# Patient Record
Sex: Female | Born: 1980 | Race: White | Hispanic: No | Marital: Single | Smoking: Current every day smoker
Health system: Southern US, Community
[De-identification: ages and names within clinical notes are randomized; demographics above are authoritative.]

## PROBLEM LIST (undated history)

## (undated) DIAGNOSIS — O139 Gestational [pregnancy-induced] hypertension without significant proteinuria, unspecified trimester: Secondary | ICD-10-CM

## (undated) HISTORY — DX: Gestational (pregnancy-induced) hypertension without significant proteinuria, unspecified trimester: O13.9

## (undated) HISTORY — PX: TONSILLECTOMY: SUR1361

## (undated) HISTORY — PX: BARTHOLIN GLAND CYST EXCISION: SHX565

---

## 2012-10-29 DIAGNOSIS — O1495 Unspecified pre-eclampsia, complicating the puerperium: Secondary | ICD-10-CM

## 2015-11-18 ENCOUNTER — Emergency Department (HOSPITAL_COMMUNITY)
Admission: EM | Admit: 2015-11-18 | Discharge: 2015-11-18 | Disposition: A | Discharge status: Home / Self Care | Payer: Self-pay | Attending: Emergency Medicine | Admitting: Emergency Medicine

## 2015-11-18 ENCOUNTER — Encounter (HOSPITAL_COMMUNITY): Payer: Self-pay | Admitting: Emergency Medicine

## 2015-11-18 DIAGNOSIS — B9689 Other specified bacterial agents as the cause of diseases classified elsewhere: Secondary | ICD-10-CM | POA: Insufficient documentation

## 2015-11-18 DIAGNOSIS — N760 Acute vaginitis: Secondary | ICD-10-CM | POA: Insufficient documentation

## 2015-11-18 DIAGNOSIS — F17210 Nicotine dependence, cigarettes, uncomplicated: Secondary | ICD-10-CM | POA: Insufficient documentation

## 2015-11-18 DIAGNOSIS — N390 Urinary tract infection, site not specified: Secondary | ICD-10-CM | POA: Insufficient documentation

## 2015-11-18 LAB — URINALYSIS, ROUTINE W REFLEX MICROSCOPIC
Bilirubin Urine: NEGATIVE
GLUCOSE, UA: NEGATIVE mg/dL
KETONES UR: NEGATIVE mg/dL
Nitrite: NEGATIVE
PH: 6 (ref 5.0–8.0)
Protein, ur: NEGATIVE mg/dL
SPECIFIC GRAVITY, URINE: 1.016 (ref 1.005–1.030)

## 2015-11-18 LAB — COMPREHENSIVE METABOLIC PANEL
ALBUMIN: 3.9 g/dL (ref 3.5–5.0)
ALT: 16 U/L (ref 14–54)
ANION GAP: 8 (ref 5–15)
AST: 21 U/L (ref 15–41)
Alkaline Phosphatase: 63 U/L (ref 38–126)
BUN: 11 mg/dL (ref 6–20)
CHLORIDE: 107 mmol/L (ref 101–111)
CO2: 22 mmol/L (ref 22–32)
Calcium: 9.7 mg/dL (ref 8.9–10.3)
Creatinine, Ser: 0.82 mg/dL (ref 0.44–1.00)
GFR calc Af Amer: >60 mL/min (ref >60)
Glucose, Bld: 97 mg/dL (ref 65–99)
POTASSIUM: 3.8 mmol/L (ref 3.5–5.1)
Sodium: 137 mmol/L (ref 135–145)
Total Bilirubin: 0.4 mg/dL (ref 0.3–1.2)
Total Protein: 7.1 g/dL (ref 6.5–8.1)

## 2015-11-18 LAB — URINE MICROSCOPIC-ADD ON

## 2015-11-18 LAB — LIPASE, BLOOD: LIPASE: 27 U/L (ref 11–51)

## 2015-11-18 LAB — I-STAT BETA HCG BLOOD, ED (MC, WL, AP ONLY): I-stat hCG, quantitative: <5 m[IU]/mL (ref <5)

## 2015-11-18 LAB — WET PREP, GENITAL
Sperm: NONE SEEN
Trich, Wet Prep: NONE SEEN
WBC, Wet Prep HPF POC: NONE SEEN
YEAST WET PREP: NONE SEEN

## 2015-11-18 LAB — CBC
HEMATOCRIT: 41.1 % (ref 36.0–46.0)
HEMOGLOBIN: 13.7 g/dL (ref 12.0–15.0)
MCH: 30.1 pg (ref 26.0–34.0)
MCHC: 33.3 g/dL (ref 30.0–36.0)
MCV: 90.3 fL (ref 78.0–100.0)
Platelets: 217 10*3/uL (ref 150–400)
RBC: 4.55 MIL/uL (ref 3.87–5.11)
RDW: 12.2 % (ref 11.5–15.5)
WBC: 11.9 10*3/uL — AB (ref 4.0–10.5)

## 2015-11-18 MED ORDER — CEPHALEXIN 250 MG PO CAPS
500.0000 mg | ORAL_CAPSULE | Freq: Once | ORAL | Status: AC
  Administered 2015-11-18: 500 mg via ORAL
  Filled 2015-11-18: qty 2

## 2015-11-18 MED ORDER — CEPHALEXIN 500 MG PO CAPS: 500.0000 mg | ORAL_CAPSULE | Freq: Two times a day (BID) | ORAL | Status: DC

## 2015-11-18 MED ORDER — METRONIDAZOLE 500 MG PO TABS: 500.0000 mg | ORAL_TABLET | Freq: Two times a day (BID) | ORAL | Status: DC

## 2015-11-18 NOTE — Discharge Instructions (Signed)
Urinary Tract Infection Urinary tract infections (UTIs) can develop anywhere along your urinary tract. Your urinary tract is your body's drainage system for removing wastes and extra water. Your urinary tract includes two kidneys, two ureters, a bladder, and a urethra. Your kidneys are a pair of bean-shaped organs. Each kidney is about the size of your fist. They are located below your ribs, one on each side of your spine. CAUSES Infections are caused by microbes, which are microscopic organisms, including fungi, viruses, and bacteria. These organisms are so small that they can only be seen through a microscope. Bacteria are the microbes that most commonly cause UTIs. SYMPTOMS  Symptoms of UTIs may vary by age and gender of the patient and by the location of the infection. Symptoms in young women typically include a frequent and intense urge to urinate and a painful, burning feeling in the bladder or urethra during urination. Older women and men are more likely to be tired, shaky, and weak and have muscle aches and abdominal pain. A fever may mean the infection is in your kidneys. Other symptoms of a kidney infection include pain in your back or sides below the ribs, nausea, and vomiting. DIAGNOSIS To diagnose a UTI, your caregiver will ask you about your symptoms. Your caregiver will also ask you to provide a urine sample. The urine sample will be tested for bacteria and white blood cells. White blood cells are made by your body to help fight infection. TREATMENT  Typically, UTIs can be treated with medication. Because most UTIs are caused by a bacterial infection, they usually can be treated with the use of antibiotics. The choice of antibiotic and length of treatment depend on your symptoms and the type of bacteria causing your infection. HOME CARE INSTRUCTIONS  If you were prescribed antibiotics, take them exactly as your caregiver instructs you. Finish the medication even if you feel better after  you have only taken some of the medication.  Drink enough water and fluids to keep your urine clear or pale yellow.  Avoid caffeine, tea, and carbonated beverages. They tend to irritate your bladder.  Empty your bladder often. Avoid holding urine for long periods of time.  Empty your bladder before and after sexual intercourse.  After a bowel movement, women should cleanse from front to back. Use each tissue only once. SEEK MEDICAL CARE IF:   You have back pain.  You develop a fever.  Your symptoms do not begin to resolve within 3 days. SEEK IMMEDIATE MEDICAL CARE IF:   You have severe back pain or lower abdominal pain.  You develop chills.  You have nausea or vomiting.  You have continued burning or discomfort with urination. MAKE SURE YOU:   Understand these instructions.  Will watch your condition.  Will get help right away if you are not doing well or get worse.   This information is not intended to replace advice given to you by your health care provider. Make sure you discuss any questions you have with your health care provider.   Document Released: 02/23/2005 Document Revised: 02/04/2015 Document Reviewed: 06/24/2011 Elsevier Interactive Patient Education 2016 Elsevier Inc. Bacterial Vaginosis Bacterial vaginosis is a vaginal infection that occurs when the normal balance of bacteria in the vagina is disrupted. It results from an overgrowth of certain bacteria. This is the most common vaginal infection in women of childbearing age. Treatment is important to prevent complications, especially in pregnant women, as it can cause a premature delivery. CAUSES  Bacterial   vaginosis is caused by an increase in harmful bacteria that are normally present in smaller amounts in the vagina. Several different kinds of bacteria can cause bacterial vaginosis. However, the reason that the condition develops is not fully understood. RISK FACTORS Certain activities or behaviors can  put you at an increased risk of developing bacterial vaginosis, including:  Having a new sex partner or multiple sex partners.  Douching.  Using an intrauterine device (IUD) for contraception. Women do not get bacterial vaginosis from toilet seats, bedding, swimming pools, or contact with objects around them. SIGNS AND SYMPTOMS  Some women with bacterial vaginosis have no signs or symptoms. Common symptoms include:  Grey vaginal discharge.  A fishlike odor with discharge, especially after sexual intercourse.  Itching or burning of the vagina and vulva.  Burning or pain with urination. DIAGNOSIS  Your health care provider will take a medical history and examine the vagina for signs of bacterial vaginosis. A sample of vaginal fluid may be taken. Your health care provider will look at this sample under a microscope to check for bacteria and abnormal cells. A vaginal pH test may also be done.  TREATMENT  Bacterial vaginosis may be treated with antibiotic medicines. These may be given in the form of a pill or a vaginal cream. A second round of antibiotics may be prescribed if the condition comes back after treatment. Because bacterial vaginosis increases your risk for sexually transmitted diseases, getting treated can help reduce your risk for chlamydia, gonorrhea, HIV, and herpes. HOME CARE INSTRUCTIONS   Only take over-the-counter or prescription medicines as directed by your health care provider.  If antibiotic medicine was prescribed, take it as directed. Make sure you finish it even if you start to feel better.  Tell all sexual partners that you have a vaginal infection. They should see their health care provider and be treated if they have problems, such as a mild rash or itching.  During treatment, it is important that you follow these instructions:  Avoid sexual activity or use condoms correctly.  Do not douche.  Avoid alcohol as directed by your health care provider.  Avoid  breastfeeding as directed by your health care provider. SEEK MEDICAL CARE IF:   Your symptoms are not improving after 3 days of treatment.  You have increased discharge or pain.  You have a fever. MAKE SURE YOU:   Understand these instructions.  Will watch your condition.  Will get help right away if you are not doing well or get worse. FOR MORE INFORMATION  Centers for Disease Control and Prevention, Division of STD Prevention: www.cdc.gov/std American Sexual Health Association (ASHA): www.ashastd.org    This information is not intended to replace advice given to you by your health care provider. Make sure you discuss any questions you have with your health care provider.   Document Released: 05/16/2005 Document Revised: 06/06/2014 Document Reviewed: 12/26/2012 Elsevier Interactive Patient Education 2016 Elsevier Inc.  

## 2015-11-18 NOTE — ED Provider Notes (Signed)
CSN: [id number]     Arrival date & time 11/18/15  1422 History  By signing my name below, I, Tanda Rockers, attest that this documentation has been prepared under the direction and in the presence of Hannah Farrier, PA-C. Electronically Signed: Tanda Rockers, ED Scribe. 11/18/2015. 7:20 PM.   Chief Complaint  Patient presents with  . Abdominal Pain    The history is provided by the patient. No language interpreter was used.    HPI Comments: Hannah Hamilton is a 35 y.o. female who presents to the Emergency Department complaining of gradual onset, constant, suprapubic abdominal pain x 1 week. Pt also complains of a cramping pain with urination, urinary frequency, nausea and dysuria. She took Ibuprofen earlier today without relief. Pt is sexually active but does not use protection. Hx STDs. She cannot specifically say when her LMP was but states sometime in May. Pt has been having irregular menses. PSHx cesarean section.Denies vaginal bleeding, vaginal discharge, urgency, vomiting, diarrhea, rash, cough, shortness of breath, sore throat, mouth sores, lightheadedness, syncope, back pain, or any other associated symptoms.    History reviewed. No pertinent past medical history. History reviewed. No pertinent past surgical history. No family history on file. Social History  Substance Use Topics  . Smoking status: Current Every Day Smoker -- 0.50 packs/day    Types: Cigarettes  . Smokeless tobacco: None  . Alcohol Use: No   OB History    No data available     Review of Systems  Constitutional: Negative for fever and chills.  HENT: Negative for congestion and sore throat.   Eyes: Negative for visual disturbance.  Respiratory: Negative for cough, shortness of breath and wheezing.   Cardiovascular: Negative for chest pain and palpitations.  Gastrointestinal: Positive for nausea and abdominal pain. Negative for vomiting and diarrhea.  Genitourinary: Positive for dysuria and frequency. Negative  for urgency, vaginal bleeding and vaginal discharge.  Musculoskeletal: Negative for back pain and neck pain.  Skin: Negative for rash.  Neurological: Positive for dizziness. Negative for syncope, light-headedness and headaches.   Allergies  Review of patient's allergies indicates no known allergies.  Home Medications   Prior to Admission medications   Medication Sig Start Date End Date Taking? Authorizing Provider  cephALEXin (KEFLEX) 500 MG capsule Take 1 capsule (500 mg total) by mouth 2 (two) times daily. 11/18/15   Hannah Farrier, PA-C  ibuprofen (ADVIL,MOTRIN) 200 MG tablet Take 400 mg by mouth every 6 (six) hours as needed for moderate pain.   Yes Historical Provider, MD  metroNIDAZOLE (FLAGYL) 500 MG tablet Take 1 tablet (500 mg total) by mouth 2 (two) times daily. 11/18/15   Hannah Farrier, PA-C   BP 115/74 mmHg  Pulse 62  Temp(Src) 98.2 F (36.8 C) (Oral)  Resp 16  SpO2 99%  LMP 11/04/2015   Physical Exam  Constitutional: She appears well-developed and well-nourished. No distress.  Nontoxic appearing.  HENT:  Head: Normocephalic and atraumatic.  Mouth/Throat: Oropharynx is clear and moist.  Eyes: Conjunctivae are normal. Pupils are equal, round, and reactive to light. Right eye exhibits no discharge. Left eye exhibits no discharge.  Neck: Neck supple.  Cardiovascular: Normal rate, regular rhythm, normal heart sounds and intact distal pulses.  Exam reveals no gallop and no friction rub.   No murmur heard. Pulmonary/Chest: Effort normal and breath sounds normal. No respiratory distress. She has no wheezes. She has no rales.  Abdominal: Soft. Bowel sounds are normal. She exhibits no mass. There is tenderness. There is no  rebound and no guarding.  Mild suprapubic tenderness to palpation. No RLQ tenderness. No rebound tenderness. No CVA or flank tenderness. No Psoas of Obturator's sign.   Genitourinary: Vaginal discharge found.  Pelvic exam performed by me with female RN  chaperone. Large amount of white vaginal discharge noted. No cervical motion tenderness. No vaginal bleeding. No external lesions or rashes. She has mild suprapubic tenderness. No adnexal tenderness or dullness.  Musculoskeletal: She exhibits no edema.  Lymphadenopathy:    She has no cervical adenopathy.  Neurological: She is alert. Coordination normal.  Skin: Skin is warm and dry. No rash noted. She is not diaphoretic. No erythema. No pallor.  Psychiatric: She has a normal mood and affect. Her behavior is normal.  Nursing note and vitals reviewed.   ED Course  Procedures (including critical care time)  DIAGNOSTIC STUDIES: Oxygen Saturation is 100% on RA, normal by my interpretation.    COORDINATION OF CARE: 7:19 PM-Discussed treatment plan which includes pelvic exam with pt at bedside and pt agreed to plan.   Labs Review Labs Reviewed  WET PREP, GENITAL - Abnormal; Notable for the following:    Clue Cells Wet Prep HPF POC PRESENT (*)    All other components within normal limits  CBC - Abnormal; Notable for the following:    WBC 11.9 (*)    All other components within normal limits  URINALYSIS, ROUTINE W REFLEX MICROSCOPIC (NOT AT [facility]) - Abnormal; Notable for the following:    APPearance CLOUDY (*)    Hgb urine dipstick MODERATE (*)    Leukocytes, UA MODERATE (*)    All other components within normal limits  URINE MICROSCOPIC-ADD ON - Abnormal; Notable for the following:    Squamous Epithelial / LPF 0-5 (*)    Bacteria, UA MANY (*)    All other components within normal limits  LIPASE, BLOOD  COMPREHENSIVE METABOLIC PANEL  I-STAT BETA HCG BLOOD, ED ([facility], [facility], [facility] ONLY)  GC/CHLAMYDIA PROBE AMP ([place]) NOT AT [facility]    Imaging Review No results found. I have personally reviewed and evaluated these lab results as part of my medical decision-making.   EKG Interpretation None     Filed Vitals:   11/18/15 1900 11/18/15 1915 11/18/15 1930 11/18/15 2130  BP: 104/58 118/87  109/79 115/74  Pulse: 63 104 63 62  Temp:    98.2 F (36.8 C)  TempSrc:    Oral  Resp:    16  SpO2: 97% 98% 98% 99%    MDM   Meds given in ED:  Medications  cephALEXin (KEFLEX) capsule 500 mg (500 mg Oral Given 11/18/15 2128)    Discharge Medication List as of 11/18/2015  9:25 PM    START taking these medications   Details  cephALEXin (KEFLEX) 500 MG capsule Take 1 capsule (500 mg total) by mouth 2 (two) times daily., Starting 11/18/2015, Until Discontinued, Print    metroNIDAZOLE (FLAGYL) 500 MG tablet Take 1 tablet (500 mg total) by mouth 2 (two) times daily., Starting 11/18/2015, Until Discontinued, Print        Final diagnoses:  UTI (lower urinary tract infection)  BV (bacterial vaginosis)   This  is a 35 y.o. female who presents to the Emergency Department complaining of gradual onset, constant, suprapubic abdominal pain x 1 week. Pt also complains of a cramping pain with urination, urinary frequency, nausea and dysuria. She took Ibuprofen earlier today without relief. Pt is sexually active but does not use protection. Hx STDs. She cannot  specifically say when her LMP was but states sometime in May. Pt has been having irregular menses. She denies vaginal bleeding or vaginal discharge. On exam the patient is afebrile nontoxic appearing. She has mild suprapubic tenderness to palpation. No CVA or flank tenderness. On pelvic exam she has moderate white vaginal discharge. No cervical motion tenderness. No adnexal tenderness or fullness. Pregnancy test is negative. Urinalysis shows moderate leukocytes with many bacteria. Lipase is within normal limits. CMP is within normal limits. CBC is remarkable only for leukocytosis with a white count of 11,900. Wet prep reveals clue cells. It is otherwise unremarkable.  We'll treat patient for a urinary tract infection and bacterial vaginosis with Keflex and Flagyl. I encouraged her to follow-up with primary care. I discussed return precautions. I  advised the patient to follow-up with their primary care provider this week. I advised the patient to return to the emergency department with new or worsening symptoms or new concerns. The patient verbalized understanding and agreement with plan.    I personally performed the services described in this documentation, which was scribed in my presence. The recorded information has been reviewed and is accurate.         Hannah FarrierWilliam Reita Shindler, PA-C 11/18/15 2149  Arby BarretteMarcy Pfeiffer, MD 11/18/15 [phone]

## 2015-11-18 NOTE — ED Notes (Signed)
Pt states for the last week she has had lower abd pain worse at the end of urination. Pt denies any back or flank pain.

## 2015-11-19 LAB — GC/CHLAMYDIA PROBE AMP (~~LOC~~) NOT AT ARMC
CHLAMYDIA, DNA PROBE: NEGATIVE
NEISSERIA GONORRHEA: NEGATIVE

## 2016-01-07 ENCOUNTER — Emergency Department (HOSPITAL_COMMUNITY)
Admission: EM | Admit: 2016-01-07 | Discharge: 2016-01-07 | Disposition: A | Discharge status: Home / Self Care | Payer: Self-pay | Attending: Emergency Medicine | Admitting: Emergency Medicine

## 2016-01-07 ENCOUNTER — Encounter (HOSPITAL_COMMUNITY): Payer: Self-pay | Admitting: Emergency Medicine

## 2016-01-07 DIAGNOSIS — F17210 Nicotine dependence, cigarettes, uncomplicated: Secondary | ICD-10-CM | POA: Insufficient documentation

## 2016-01-07 DIAGNOSIS — Z79899 Other long term (current) drug therapy: Secondary | ICD-10-CM | POA: Insufficient documentation

## 2016-01-07 DIAGNOSIS — N390 Urinary tract infection, site not specified: Secondary | ICD-10-CM | POA: Insufficient documentation

## 2016-01-07 DIAGNOSIS — Z791 Long term (current) use of non-steroidal anti-inflammatories (NSAID): Secondary | ICD-10-CM | POA: Insufficient documentation

## 2016-01-07 LAB — URINALYSIS, ROUTINE W REFLEX MICROSCOPIC
Bilirubin Urine: NEGATIVE
GLUCOSE, UA: NEGATIVE mg/dL
KETONES UR: 15 mg/dL — AB
Nitrite: POSITIVE — AB
PROTEIN: 100 mg/dL — AB
Specific Gravity, Urine: 1.02 (ref 1.005–1.030)
pH: 6 (ref 5.0–8.0)

## 2016-01-07 LAB — URINE MICROSCOPIC-ADD ON

## 2016-01-07 LAB — PREGNANCY, URINE: Preg Test, Ur: NEGATIVE

## 2016-01-07 MED ORDER — CEPHALEXIN 500 MG PO CAPS: 500.0000 mg | ORAL_CAPSULE | Freq: Four times a day (QID) | ORAL | 0 refills | Status: DC

## 2016-01-07 MED ORDER — SODIUM CHLORIDE 0.9 % IJ SOLN
INTRAMUSCULAR | Status: AC
  Filled 2016-01-07: qty 10

## 2016-01-07 MED ORDER — IBUPROFEN 400 MG PO TABS
400.0000 mg | ORAL_TABLET | Freq: Once | ORAL | Status: AC
  Administered 2016-01-07: 400 mg via ORAL
  Filled 2016-01-07: qty 1

## 2016-01-07 MED ORDER — FLUCONAZOLE 100 MG PO TABS
200.0000 mg | ORAL_TABLET | Freq: Once | ORAL | Status: AC
  Administered 2016-01-07: 200 mg via ORAL
  Filled 2016-01-07: qty 2

## 2016-01-07 MED ORDER — TRAMADOL HCL 50 MG PO TABS: ORAL_TABLET | ORAL | 0 refills | Status: DC

## 2016-01-07 MED ORDER — CEFTRIAXONE SODIUM 1 G IJ SOLR
1.0000 g | Freq: Once | INTRAMUSCULAR | Status: AC
  Administered 2016-01-07: 1 g via INTRAMUSCULAR
  Filled 2016-01-07: qty 10

## 2016-01-07 MED ORDER — HYDROCODONE-ACETAMINOPHEN 5-325 MG PO TABS
2.0000 | ORAL_TABLET | Freq: Once | ORAL | Status: AC
  Administered 2016-01-07: 2 via ORAL
  Filled 2016-01-07: qty 2

## 2016-01-07 MED ORDER — ONDANSETRON HCL 4 MG PO TABS
4.0000 mg | ORAL_TABLET | Freq: Once | ORAL | Status: AC
  Administered 2016-01-07: 4 mg via ORAL
  Filled 2016-01-07: qty 1

## 2016-01-07 NOTE — ED Provider Notes (Signed)
[facility]-EMERGENCY DEPT Provider Note   CSN: [id number] Arrival date & time: 01/07/16  1728  First Provider Contact:  None       History   Chief Complaint Chief Complaint  Patient presents with  . Recurrent UTI    HPI Janeann ForehandFaith Deboer is a 35 y.o. female.  Patient is a 35 year old female who presents to the emergency department with a complaint of difficulty with urination.  The patient states that over the last 10 days she's been having increasing frequency of urination. She is having pain over the bladder area. At times she is having right flank area on pain. She's had chills and fever on. She's had mild nausea, but no actual vomiting. She states that time she has episodes of feeling weak, but she has been able to drink take care of her activities of daily living without problem. She has tried over-the-counter medications for urinary tract infection, but states these are not helping. Nothing seems to make the problem any worse.   The history is provided by the patient.    History reviewed. No pertinent past medical history.  There are no active problems to display for this patient.   History reviewed. No pertinent surgical history.  OB History    No data available       Home Medications    Prior to Admission medications   Medication Sig Start Date End Date Taking? Authorizing Provider  cephALEXin (KEFLEX) 500 MG capsule Take 1 capsule (500 mg total) by mouth 2 (two) times daily. 11/18/15   Everlene FarrierWilliam Dansie, PA-C  ibuprofen (ADVIL,MOTRIN) 200 MG tablet Take 400 mg by mouth every 6 (six) hours as needed for moderate pain.    Historical Provider, MD  metroNIDAZOLE (FLAGYL) 500 MG tablet Take 1 tablet (500 mg total) by mouth 2 (two) times daily. 11/18/15   Everlene FarrierWilliam Dansie, PA-C    Family History History reviewed. No pertinent family history.  Social History Social History  Substance Use Topics  . Smoking status: Current Every Day Smoker    Packs/day: 0.50    Types:  Cigarettes  . Smokeless tobacco: Never Used  . Alcohol use No     Allergies   Review of patient's allergies indicates no known allergies.   Review of Systems Review of Systems  Constitutional: Positive for chills and fever. Negative for activity change.       All ROS Neg except as noted in HPI  HENT: Negative for nosebleeds.   Eyes: Negative for photophobia and discharge.  Respiratory: Negative for cough, shortness of breath and wheezing.   Cardiovascular: Negative for chest pain and palpitations.  Gastrointestinal: Negative for abdominal pain and blood in stool.  Genitourinary: Positive for dysuria, flank pain and frequency. Negative for hematuria.  Musculoskeletal: Negative for arthralgias, back pain and neck pain.  Skin: Negative.   Neurological: Negative for dizziness, seizures and speech difficulty.  Psychiatric/Behavioral: Negative for confusion and hallucinations.     Physical Exam Updated Vital Signs BP 115/63   Pulse 83   Temp 100.7 F (38.2 C)   Resp 18   Ht 5\' 4"  (1.626 m)   Wt 68 kg   LMP 12/27/2015 (Exact Date)   SpO2 99%   BMI 25.75 kg/m   Physical Exam  Constitutional: She is oriented to person, place, and time. She appears well-developed and well-nourished.  Non-toxic appearance.  HENT:  Head: Normocephalic.  Right Ear: Tympanic membrane and external ear normal.  Left Ear: Tympanic membrane and external ear normal.  Eyes:  EOM and lids are normal. Pupils are equal, round, and reactive to light.  Neck: Normal range of motion. Neck supple. Carotid bruit is not present.  Cardiovascular: Normal rate, regular rhythm, normal heart sounds, intact distal pulses and normal pulses.   Pulmonary/Chest: Breath sounds normal. No respiratory distress.  Abdominal: Soft. Bowel sounds are normal. There is no hepatosplenomegaly. There is no tenderness. There is CVA tenderness. There is no guarding.  Musculoskeletal: Normal range of motion.  Lymphadenopathy:        Head (right side): No submandibular adenopathy present.       Head (left side): No submandibular adenopathy present.    She has no cervical adenopathy.  Neurological: She is alert and oriented to person, place, and time. She has normal strength. No cranial nerve deficit or sensory deficit.  Skin: Skin is warm and dry.  Psychiatric: She has a normal mood and affect. Her speech is normal.  Nursing note and vitals reviewed.    ED Treatments / Results  Labs (all labs ordered are listed, but only abnormal results are displayed) Labs Reviewed  URINALYSIS, ROUTINE W REFLEX MICROSCOPIC (NOT AT [facility]) - Abnormal; Notable for the following:       Result Value   APPearance HAZY (*)    Hgb urine dipstick LARGE (*)    Ketones, ur 15 (*)    Protein, ur 100 (*)    Nitrite POSITIVE (*)    Leukocytes, UA LARGE (*)    All other components within normal limits  URINE MICROSCOPIC-ADD ON - Abnormal; Notable for the following:    Squamous Epithelial / LPF 0-5 (*)    Bacteria, UA MANY (*)    All other components within normal limits  URINE CULTURE  PREGNANCY, URINE    EKG  EKG Interpretation None       Radiology No results found.  Procedures Procedures (including critical care time)  Medications Ordered in ED Medications  cefTRIAXone (ROCEPHIN) injection 1 g (not administered)  HYDROcodone-acetaminophen (NORCO/VICODIN) 5-325 MG per tablet 2 tablet (not administered)  ibuprofen (ADVIL,MOTRIN) tablet 400 mg (not administered)  ondansetron (ZOFRAN) tablet 4 mg (not administered)     Initial Impression / Assessment and Plan / ED Course  I have reviewed the triage vital signs and the nursing notes.  Pertinent labs & imaging results that were available during my care of the patient were reviewed by me and considered in my medical decision making (see chart for details).  Clinical Course    *I have reviewed nursing notes, vital signs, and all appropriate lab and imaging results for this  patient.**  Final Clinical Impressions(s) / ED Diagnoses  Vital signs reviewed. Urinalysis reveals a hazy yellow specimen with a specific gravity 1.020. There is a large leukocyte esterase present. There is a large hemoglobin present, and is a positive nitrate present. There are too many to count white blood cells per high-powered field on. The patient will have the urine cultured. The patient will also be treated with on Keflex 500 mg 4 times daily. I have advised patient to increase fluids. I've also advised the family to see the primary physician for recheck of the urine in about 7-10 days. Patient is in agreement with this discharge plan.   Final diagnoses:  None    New Prescriptions New Prescriptions   No medications on file     Ivery Quale, PA-C 01/07/16 1914    Eber Hong, MD 01/08/16 1315

## 2016-01-07 NOTE — Discharge Instructions (Signed)
Please use Tylenol every 4 hours or ibuprofen every 6 hours for control of your temperature elevations. It is important that you increase your water, juices, Gatorade's, etc. Please use Keflex 4 times daily with food. Use Tylenol or ibuprofen for mild pain, may use Ultram for more severe pain. This medication may cause drowsiness, please do not drive, drink alcohol, or operate machinery when taking this medication. Please see your primary physician, or the local health department for recheck of your urine in 7-10 days to ensure that this problem has been resolved.

## 2016-01-07 NOTE — ED Triage Notes (Signed)
C/o cloudy urine for last couple weeks.  C/o right flank pain rates pain 8/10.  C/o chills and nausea.

## 2016-01-10 LAB — URINE CULTURE

## 2016-01-11 ENCOUNTER — Telehealth (HOSPITAL_BASED_OUTPATIENT_CLINIC_OR_DEPARTMENT_OTHER): Payer: Self-pay | Admitting: Emergency Medicine

## 2016-01-11 NOTE — Telephone Encounter (Signed)
Post ED Visit - Positive Culture Follow-up  Culture report reviewed by antimicrobial stewardship pharmacist:  []  Enzo BiNathan Batchelder, Pharm.D. []  Celedonio MiyamotoJeremy Frens, Pharm.D., BCPS []  Garvin FilaMike Maccia, Pharm.D. []  Georgina PillionElizabeth Martin, Pharm.D., BCPS []  [place]svilleMinh Pham, [address]Pharm[address]D., BCPS, AAHIVP []  Estella HuskMichelle Turner, Pharm.D., BCPS, AAHIVP []  Tennis Mustassie Stewart, Pharm.D. []  Sherle Poeob Vincent, [address]Pharm[address]  Positive urine culture Treated with cephalexin, organism sensitive to the same and no further patient follow-up is required at this time.  Berle MullMiller, Malon Siddall 01/11/2016, 12:28 PM

## 2017-07-11 ENCOUNTER — Encounter (HOSPITAL_COMMUNITY): Payer: Self-pay | Admitting: *Deleted

## 2017-07-11 ENCOUNTER — Emergency Department (HOSPITAL_COMMUNITY): Payer: Self-pay

## 2017-07-11 ENCOUNTER — Emergency Department (HOSPITAL_COMMUNITY)
Admission: EM | Admit: 2017-07-11 | Discharge: 2017-07-11 | Disposition: A | Discharge status: Home / Self Care | Payer: Self-pay | Attending: Emergency Medicine | Admitting: Emergency Medicine

## 2017-07-11 DIAGNOSIS — F17210 Nicotine dependence, cigarettes, uncomplicated: Secondary | ICD-10-CM | POA: Insufficient documentation

## 2017-07-11 DIAGNOSIS — Z79899 Other long term (current) drug therapy: Secondary | ICD-10-CM | POA: Insufficient documentation

## 2017-07-11 DIAGNOSIS — M25561 Pain in right knee: Secondary | ICD-10-CM | POA: Insufficient documentation

## 2017-07-11 MED ORDER — ACETAMINOPHEN 500 MG PO TABS: 1000.0000 mg | ORAL_TABLET | Freq: Three times a day (TID) | ORAL | 0 refills | Status: DC

## 2017-07-11 MED ORDER — DICLOFENAC SODIUM 1 % TD GEL: 2.0000 g | Freq: Four times a day (QID) | TRANSDERMAL | 0 refills | Status: DC

## 2017-07-11 MED ORDER — PREDNISONE 50 MG PO TABS: 50.0000 mg | ORAL_TABLET | Freq: Every day | ORAL | 0 refills | Status: AC

## 2017-07-11 NOTE — Discharge Instructions (Signed)
Take prednisone as prescribed.  Do not take other anti-inflammatories at the same time (Advil, Motrin, naproxen, Aleve, ibuprofen).  Take Tylenol 3 times a day for pain. Use Voltaren gel as needed for pain. Wear the knee sleeve at night and when able to during the day. Try to rest, ice, and elevate your knee when able. Follow-up with orthopedic doctor in 1 week if your pain is not improving. Return to the emergency room if you develop numbness, if your knee turns red and hot, or if you have any new or worsening symptoms.

## 2017-07-11 NOTE — ED Triage Notes (Signed)
Pt reports rt knee pain that has been ongoing for 3 weeks. Pt denies any recent injury however reports past injury 7 years ago. Pt reports swelling as well.

## 2017-07-11 NOTE — ED Provider Notes (Signed)
[facility]   CSN: [id number] Arrival date & time: 07/11/17  0807     History   Chief Complaint Chief Complaint  Patient presents with  . Knee Pain    HPI Hannah Hamilton is a 37 y.o. female presenting for evaluation of right knee pain.  Patient states that for the past several weeks, she has had right knee pain and swelling.  This is worsened over the past week.  She denies fall, trauma, or injury.  Pain is medial.  Pain is worse when she is walking for long periods of time.  She has been trying Tylenol, ibuprofen, and Aleve with mild relief of pain.  She denies recent injury, although states that she injured her knee about 7 years ago.  She has never had surgery, there were no broken bones at that time.  She denies pain elsewhere, including in her hip or ankle.  Knee is not warm or red.  She denies fevers or chills.  She has no other medical problems, does not take medications daily.  She denies numbness or tingling.  She has no pain at rest.  No radiation of the pain.  HPI  No past medical history on file.  There are no active problems to display for this patient.   History reviewed. No pertinent surgical history.  OB History    No data available       Home Medications    Prior to Admission medications   Medication Sig Start Date End Date Taking? Authorizing Provider  acetaminophen (TYLENOL) 500 MG tablet Take 2 tablets (1,000 mg total) by mouth 3 (three) times daily. 07/11/17   Tayloranne Lekas, PA-C  cephALEXin (KEFLEX) 500 MG capsule Take 1 capsule (500 mg total) by mouth 4 (four) times daily. 01/07/16   Ivery QualeBryant, Hobson, PA-C  diclofenac sodium (VOLTAREN) 1 % GEL Apply 2 g topically 4 (four) times daily. 07/11/17   Rocco Kerkhoff, PA-C  ibuprofen (ADVIL,MOTRIN) 200 MG tablet Take 400 mg by mouth every 6 (six) hours as needed for moderate pain.    [provider]  metroNIDAZOLE (FLAGYL) 500 MG tablet Take 1  tablet (500 mg total) by mouth 2 (two) times daily. 11/18/15   Everlene Farrieransie, William, PA-C  predniSONE (DELTASONE) 50 MG tablet Take 1 tablet (50 mg total) by mouth daily for 5 days. 07/11/17 07/16/17  Sira Adsit, PA-C  traMADol (ULTRAM) 50 MG tablet 1 or 2 tabs po with food q6h prn pain. 01/07/16   Ivery QualeBryant, Hobson, PA-C    Family History No family history on file.  Social History Social History   Tobacco Use  . Smoking status: Current Every Day Smoker    Packs/day: 0.50    Types: Cigarettes  . Smokeless tobacco: Never Used  Substance Use Topics  . Alcohol use: No  . Drug use: No     Allergies   Patient has no known allergies.   Review of Systems Review of Systems  Constitutional: Negative for chills and fever.  Musculoskeletal: Positive for arthralgias and joint swelling.  Neurological: Negative for numbness.     Physical Exam Updated Vital Signs BP 105/65 (BP Location: Right Arm)   Pulse 67   Temp 98.7 F (37.1 C) (Oral)   Resp 16   LMP 07/08/2017   SpO2 99%   Physical Exam  Constitutional: She is oriented to person, place, and time. She appears well-developed and well-nourished. No distress.  HENT:  Head: Normocephalic and atraumatic.  Eyes: EOM  are normal.  Neck: Normal range of motion.  Pulmonary/Chest: Effort normal.  Abdominal: She exhibits no distension.  Musculoskeletal: Normal range of motion. She exhibits edema and tenderness.  Mild swelling of the right knee.  Tenderness to palpation of the medial aspect of the knee.  No tenderness to palpation the anterior, lateral, or posterior knee.  Popliteal pulses intact bilaterally.  Full active range of motion of the knee with minimal pain, no pain with passive range of motion.  No erythema or warmth.  Sensation intact bilaterally.  Patient is ambulatory.  No pain with varus or valgus stress.  Neurological: She is alert and oriented to person, place, and time. No sensory deficit.  Skin: Skin is warm. No rash  noted.  Psychiatric: She has a normal mood and affect.  Nursing note and vitals reviewed.    ED Treatments / Results  Labs (all labs ordered are listed, but only abnormal results are displayed) Labs Reviewed - No data to display  EKG  EKG Interpretation None       Radiology Dg Knee 2 Views Right  Result Date: 07/11/2017 CLINICAL DATA:  Pt reports rt knee pain that has been ongoing for 3 weeks. Pain started to get worse this week. Denies any recent injuries. C/o swelling and pain to medial aspect of right knee. EXAM: RIGHT KNEE - 1-2 VIEW COMPARISON:  None. FINDINGS: No acute fracture or dislocation. Mild medial femorotibial compartment joint space narrowing with marginal osteophytes. No significant joint effusion. No aggressive osseous lesion. IMPRESSION: No acute osseous injury of the right knee. Electronically Signed   By: Elige Ko   On: 07/11/2017 09:04    Procedures Procedures (including critical care time)  Medications Ordered in ED Medications - No data to display   Initial Impression / Assessment and Plan / ED Course  I have reviewed the triage vital signs and the nursing notes.  Pertinent labs & imaging results that were available during my care of the patient were reviewed by me and considered in my medical decision making (see chart for details).     Patient presenting for evaluation of right knee pain.  Physical examination, she is neurovascularly intact.  No erythema, warmth, fevers, doubt septic joint.  X-ray negative for fracture or dislocation.  Shows joint space narrowing and osteophytes, likely arthritis.  Discussed findings with patient.  Discussed treatment with knee sleeve, anti-inflammatories, and pain control.  Patient to follow-up with orthopedics as needed.  At this time, patient appears safe for discharge.  Return precautions given.  Patient states she understands and agrees to plan.   Final Clinical Impressions(s) / ED Diagnoses   Final  diagnoses:  Acute pain of right knee    ED Discharge Orders        Ordered    acetaminophen (TYLENOL) 500 MG tablet  3 times daily     07/11/17 0936    predniSONE (DELTASONE) 50 MG tablet  Daily     07/11/17 0936    diclofenac sodium (VOLTAREN) 1 % GEL  4 times daily     07/11/17 0936       Alveria Apley, PA-C 07/11/17 0946    Benjiman Core, MD 07/11/17 1557

## 2017-08-02 ENCOUNTER — Emergency Department (HOSPITAL_COMMUNITY)
Admission: EM | Admit: 2017-08-02 | Discharge: 2017-08-02 | Disposition: A | Discharge status: Home / Self Care | Payer: Self-pay | Attending: Emergency Medicine | Admitting: Emergency Medicine

## 2017-08-02 ENCOUNTER — Encounter (HOSPITAL_COMMUNITY): Payer: Self-pay

## 2017-08-02 ENCOUNTER — Emergency Department (HOSPITAL_COMMUNITY): Payer: Self-pay

## 2017-08-02 ENCOUNTER — Other Ambulatory Visit: Payer: Self-pay

## 2017-08-02 DIAGNOSIS — J069 Acute upper respiratory infection, unspecified: Secondary | ICD-10-CM

## 2017-08-02 DIAGNOSIS — F17210 Nicotine dependence, cigarettes, uncomplicated: Secondary | ICD-10-CM | POA: Insufficient documentation

## 2017-08-02 DIAGNOSIS — Z79899 Other long term (current) drug therapy: Secondary | ICD-10-CM | POA: Insufficient documentation

## 2017-08-02 MED ORDER — ALBUTEROL SULFATE HFA 108 (90 BASE) MCG/ACT IN AERS: 1.0000 | INHALATION_SPRAY | Freq: Four times a day (QID) | RESPIRATORY_TRACT | 0 refills | Status: DC | PRN

## 2017-08-02 MED ORDER — BENZONATATE 100 MG PO CAPS: 100.0000 mg | ORAL_CAPSULE | Freq: Three times a day (TID) | ORAL | 0 refills | Status: DC

## 2017-08-02 MED ORDER — IPRATROPIUM-ALBUTEROL 0.5-2.5 (3) MG/3ML IN SOLN
3.0000 mL | Freq: Once | RESPIRATORY_TRACT | Status: AC
  Administered 2017-08-02: 3 mL via RESPIRATORY_TRACT
  Filled 2017-08-02: qty 3

## 2017-08-02 MED ORDER — FLUTICASONE PROPIONATE 50 MCG/ACT NA SUSP: 1.0000 | Freq: Every day | NASAL | 0 refills | Status: DC

## 2017-08-02 NOTE — ED Notes (Signed)
Samantha, PA at bedside at this time.  

## 2017-08-02 NOTE — ED Triage Notes (Signed)
Patient complains of ongoing cough and congestion x 6 days. States that her cough is worse at night and sleeping elevated. Body aches and chills with same

## 2017-08-02 NOTE — ED Provider Notes (Signed)
[facility]   CSN: [id number] Arrival date & time: 08/02/17  [id number]     History   Chief Complaint No chief complaint on file.   HPI Hannah ForehandFaith Segers is a 37 y.o. female with a past medical history of tobacco abuse who presents to the ED today complaining of cough, runny nose. Patient states that about a week ago she developed a productive cough with clear sputum. She's associated nasal congestion, runny nose. She initially had a sore throat with has since resolved. Last night she had a temperature of 100. She took Tylenol for this with some relief in her symptoms. She is also try taking over-the-counter cold eaze, apple cider vinegar with minimal relief in her symptoms. No sick contacts. Pt did not receive her flu shot this year. Pt does smoke cigarettes daily. She feels like she has been wheezing some over the past few days. Pt is not on birth control, no pmhx malignancy, recent orthopedic surgery.   HPI  History reviewed. No pertinent past medical history.  There are no active problems to display for this patient.   History reviewed. No pertinent surgical history.  OB History    No data available       Home Medications    Prior to Admission medications   Medication Sig Start Date End Date Taking? Authorizing Provider  acetaminophen (TYLENOL) 500 MG tablet Take 2 tablets (1,000 mg total) by mouth 3 (three) times daily. 07/11/17   Caccavale, Sophia, PA-C  cephALEXin (KEFLEX) 500 MG capsule Take 1 capsule (500 mg total) by mouth 4 (four) times daily. 01/07/16   Ivery QualeBryant, Hobson, PA-C  diclofenac sodium (VOLTAREN) 1 % GEL Apply 2 g topically 4 (four) times daily. 07/11/17   Caccavale, Sophia, PA-C  ibuprofen (ADVIL,MOTRIN) 200 MG tablet Take 400 mg by mouth every 6 (six) hours as needed for moderate pain.    [provider]  metroNIDAZOLE (FLAGYL) 500 MG tablet Take 1 tablet (500 mg total) by mouth 2 (two) times daily. 11/18/15    Everlene Farrieransie, William, PA-C  traMADol (ULTRAM) 50 MG tablet 1 or 2 tabs po with food q6h prn pain. 01/07/16   Ivery QualeBryant, Hobson, PA-C    Family History No family history on file.  Social History Social History   Tobacco Use  . Smoking status: Current Every Day Smoker    Packs/day: 0.50    Types: Cigarettes  . Smokeless tobacco: Never Used  Substance Use Topics  . Alcohol use: No  . Drug use: No     Allergies   Patient has no known allergies.   Review of Systems Review of Systems  All other systems reviewed and are negative.    Physical Exam Updated Vital Signs BP 110/78   Pulse 80   Temp 98.9 F (37.2 C) (Oral)   Resp (!) 22   LMP 08/02/2017   SpO2 99%   Physical Exam  Constitutional: She is oriented to person, place, and time. She appears well-developed and well-nourished. No distress.  HENT:  Head: Normocephalic and atraumatic.  Mouth/Throat: Oropharynx is clear and moist. No oropharyngeal exudate.  TM clear b/l with some cerumen  Eyes: Conjunctivae and EOM are normal. Pupils are equal, round, and reactive to light. Right eye exhibits no discharge. Left eye exhibits no discharge. No scleral icterus.  Neck: Normal range of motion. Neck supple.  Cardiovascular: Normal rate, regular rhythm, normal heart sounds and intact distal pulses. Exam reveals no gallop and no friction rub.  No murmur heard. Pulmonary/Chest: Effort normal. No respiratory distress. She has wheezes. She has no rales. She exhibits no tenderness.  Abdominal: Soft. She exhibits no distension. There is no tenderness. There is no guarding.  Musculoskeletal: Normal range of motion. She exhibits no edema.  Lymphadenopathy:    She has no cervical adenopathy.  Neurological: She is alert and oriented to person, place, and time.  Skin: Skin is warm and dry. No rash noted. She is not diaphoretic. No erythema. No pallor.  Psychiatric: She has a normal mood and affect. Her behavior is normal.  Nursing note and  vitals reviewed.    ED Treatments / Results  Labs (all labs ordered are listed, but only abnormal results are displayed) Labs Reviewed - No data to display  EKG  EKG Interpretation None       Radiology Dg Chest 2 View  Result Date: 08/02/2017 CLINICAL DATA:  Fever, cough. EXAM: CHEST - 2 VIEW COMPARISON:  None. FINDINGS: The heart size and mediastinal contours are within normal limits. Both lungs are clear. No pneumothorax or pleural effusion is noted. The visualized skeletal structures are unremarkable. IMPRESSION: No active cardiopulmonary disease. Electronically Signed   By: Lupita Raider, M.D.   On: 08/02/2017 08:50    Procedures Procedures (including critical care time)  Medications Ordered in ED Medications  ipratropium-albuterol (DUONEB) 0.5-2.5 (3) MG/3ML nebulizer solution 3 mL (not administered)     Initial Impression / Assessment and Plan / ED Course  I have reviewed the triage vital signs and the nursing notes.  Pertinent labs & imaging results that were available during my care of the patient were reviewed by me and considered in my medical decision making (see chart for details).     Pt CXR negative for acute infiltrate. Patients symptoms are consistent with URI, likely viral etiology. Pt afebrile and all vitals are stable. NO evidence of respiratory distress. Discussed that antibiotics are not indicated for viral infections. Pt did have some expiratory wheezes on exam, duoneb was given with improvement in physical exam and clinical symptoms.  Pt will be discharged with symptomatic treatment including inhaler, tessalon perrls, and flonase.  Verbalizes understanding and is agreeable with plan. Pt is hemodynamically stable & in NAD prior to dc.   Final Clinical Impressions(s) / ED Diagnoses   Final diagnoses:  None    ED Discharge Orders    None       Dub Mikes, PA-C 08/02/17 [id number]    Nira Conn, MD 08/02/17 2113

## 2017-08-02 NOTE — ED Notes (Signed)
Patient transported to X-ray 

## 2017-08-02 NOTE — Discharge Instructions (Signed)
Use inhaler as needed for wheezing. Avoid tobacco use. Use Flonase daily for nasal congestion and Tessalon Perles as needed for cough suppression. You may continue to take ibuprofen and zinc. Encouraged frequent hand washing. Follow up with her primary care provider for symptoms not improved. Return to the ED if you experience severe worsening of your symptoms, difficulty breathing, chest pain, increased fever, altered mental status.

## 2018-01-04 ENCOUNTER — Emergency Department (HOSPITAL_COMMUNITY)
Admission: EM | Admit: 2018-01-04 | Discharge: 2018-01-05 | Disposition: A | Discharge status: Home / Self Care | Payer: Self-pay | Attending: Emergency Medicine | Admitting: Emergency Medicine

## 2018-01-04 ENCOUNTER — Emergency Department (HOSPITAL_COMMUNITY): Payer: Self-pay

## 2018-01-04 ENCOUNTER — Encounter (HOSPITAL_COMMUNITY): Payer: Self-pay | Admitting: Emergency Medicine

## 2018-01-04 ENCOUNTER — Other Ambulatory Visit: Payer: Self-pay

## 2018-01-04 DIAGNOSIS — J181 Lobar pneumonia, unspecified organism: Secondary | ICD-10-CM | POA: Insufficient documentation

## 2018-01-04 DIAGNOSIS — F17210 Nicotine dependence, cigarettes, uncomplicated: Secondary | ICD-10-CM | POA: Insufficient documentation

## 2018-01-04 DIAGNOSIS — Z79899 Other long term (current) drug therapy: Secondary | ICD-10-CM | POA: Insufficient documentation

## 2018-01-04 DIAGNOSIS — J189 Pneumonia, unspecified organism: Secondary | ICD-10-CM

## 2018-01-04 LAB — BASIC METABOLIC PANEL
Anion gap: 13 (ref 5–15)
BUN: 8 mg/dL (ref 6–20)
CALCIUM: 9.2 mg/dL (ref 8.9–10.3)
CO2: 19 mmol/L — AB (ref 22–32)
CREATININE: 0.86 mg/dL (ref 0.44–1.00)
Chloride: 102 mmol/L (ref 98–111)
GFR calc Af Amer: >60 mL/min (ref >60)
Glucose, Bld: 95 mg/dL (ref 70–99)
Potassium: 3.7 mmol/L (ref 3.5–5.1)
SODIUM: 134 mmol/L — AB (ref 135–145)

## 2018-01-04 LAB — CBC
HCT: 43.4 % (ref 36.0–46.0)
Hemoglobin: 14.8 g/dL (ref 12.0–15.0)
MCH: 30.8 pg (ref 26.0–34.0)
MCHC: 34.1 g/dL (ref 30.0–36.0)
MCV: 90.4 fL (ref 78.0–100.0)
PLATELETS: 158 10*3/uL (ref 150–400)
RBC: 4.8 MIL/uL (ref 3.87–5.11)
RDW: 11.7 % (ref 11.5–15.5)
WBC: 10.4 10*3/uL (ref 4.0–10.5)

## 2018-01-04 LAB — I-STAT TROPONIN, ED: TROPONIN I, POC: 0 ng/mL (ref 0.00–0.08)

## 2018-01-04 LAB — I-STAT BETA HCG BLOOD, ED (MC, WL, AP ONLY): I-stat hCG, quantitative: <5 m[IU]/mL (ref <5)

## 2018-01-04 MED ORDER — ALBUTEROL SULFATE (2.5 MG/3ML) 0.083% IN NEBU
5.0000 mg | INHALATION_SOLUTION | Freq: Once | RESPIRATORY_TRACT | Status: AC
  Administered 2018-01-04: 5 mg via RESPIRATORY_TRACT
  Filled 2018-01-04: qty 6

## 2018-01-04 MED ORDER — ACETAMINOPHEN 325 MG PO TABS
650.0000 mg | ORAL_TABLET | Freq: Once | ORAL | Status: AC | PRN
  Administered 2018-01-05: 650 mg via ORAL
  Filled 2018-01-04: qty 2

## 2018-01-04 NOTE — ED Triage Notes (Signed)
Pt reports body aches starting Monday. Pt reports fever, highest at home 102.6, afebrile here. Pt took robitussin max around 7pm. Pt reports central non-radiating CP and SHOB starting today. Pt reports productive cough.

## 2018-01-04 NOTE — ED Notes (Signed)
Results reviewed.  No changes in acuity at this time 

## 2018-01-05 MED ORDER — AMOXICILLIN-POT CLAVULANATE 875-125 MG PO TABS: 1.0000 | ORAL_TABLET | Freq: Two times a day (BID) | ORAL | 0 refills | Status: DC

## 2018-01-05 MED ORDER — SODIUM CHLORIDE 0.9 % IV SOLN
500.0000 mg | Freq: Once | INTRAVENOUS | Status: AC
  Administered 2018-01-05: 500 mg via INTRAVENOUS
  Filled 2018-01-05: qty 500

## 2018-01-05 MED ORDER — CEFTRIAXONE SODIUM 1 G IJ SOLR
1.0000 g | Freq: Once | INTRAMUSCULAR | Status: AC
  Administered 2018-01-05: 1 g via INTRAVENOUS
  Filled 2018-01-05: qty 10

## 2018-01-05 NOTE — ED Provider Notes (Signed)
[facility] EMERGENCY DEPARTMENT Provider Note   CSN: [id number] Arrival date & time: 01/04/18  2047     History   Chief Complaint Chief Complaint  Patient presents with  . Chest Pain  . Shortness of Breath    HPI Hannah Hamilton is a 37 y.o. female.  Patient presents to the emergency department with a chief complaint of chest pain or shortness of breath.  She reports having productive cough and intermittent fevers and chills for the past week or so.  She states that she feels very fatigued, and has no energy at home.  She states that her friend encouraged her to come to the emergency department for evaluation.  She has not taken anything for her symptoms.  She denies any recent illnesses.  Denies any other associated symptoms.  The history is provided by the patient. No language interpreter was used.    No past medical history on file.  There are no active problems to display for this patient.   Past Surgical History:  Procedure Laterality Date  . CESAREAN SECTION       OB History   None      Home Medications    Prior to Admission medications   Medication Sig Start Date End Date Taking? Authorizing Provider  acetaminophen (TYLENOL) 500 MG tablet Take 2 tablets (1,000 mg total) by mouth 3 (three) times daily. Patient taking differently: Take 1,000 mg by mouth every 6 (six) hours as needed for mild pain or fever.  07/11/17  Yes Caccavale, Sophia, PA-C  albuterol (PROVENTIL HFA;VENTOLIN HFA) 108 (90 Base) MCG/ACT inhaler Inhale 1-2 puffs into the lungs every 6 (six) hours as needed for wheezing or shortness of breath. Patient not taking: Reported on 01/05/2018 08/02/17   Dowless, Lelon Mast Tripp, PA-C  benzonatate (TESSALON) 100 MG capsule Take 1 capsule (100 mg total) by mouth every 8 (eight) hours. Patient not taking: Reported on 01/05/2018 08/02/17   Dowless, Lelon Mast Tripp, PA-C  cephALEXin (KEFLEX) 500 MG capsule Take 1 capsule (500 mg total) by mouth 4 (four)  times daily. Patient not taking: Reported on 01/05/2018 01/07/16   Ivery Quale, PA-C  diclofenac sodium (VOLTAREN) 1 % GEL Apply 2 g topically 4 (four) times daily. Patient not taking: Reported on 01/05/2018 07/11/17   Caccavale, Sophia, PA-C  fluticasone (FLONASE) 50 MCG/ACT nasal spray Place 1 spray into both nostrils daily. Patient not taking: Reported on 01/05/2018 08/02/17   Dowless, Lelon Mast Tripp, PA-C  ibuprofen (ADVIL,MOTRIN) 200 MG tablet Take 400 mg by mouth every 6 (six) hours as needed for moderate pain.    [provider]  metroNIDAZOLE (FLAGYL) 500 MG tablet Take 1 tablet (500 mg total) by mouth 2 (two) times daily. Patient not taking: Reported on 01/05/2018 11/18/15   Everlene Farrier, PA-C  traMADol (ULTRAM) 50 MG tablet 1 or 2 tabs po with food q6h prn pain. Patient not taking: Reported on 01/05/2018 01/07/16   Ivery Quale, PA-C    Family History No family history on file.  Social History Social History   Tobacco Use  . Smoking status: Current Every Day Smoker    Packs/day: 0.50    Types: Cigarettes  . Smokeless tobacco: Never Used  Substance Use Topics  . Alcohol use: Yes    Comment: occasionally  . Drug use: No     Allergies   Patient has no known allergies.   Review of Systems Review of Systems  All other systems reviewed and are negative.  Physical Exam Updated Vital Signs BP 127/68   Pulse 78   Temp 99.6 F (37.6 C) (Oral)   Resp 18   LMP 01/04/2018   SpO2 96%   Physical Exam  Constitutional: She is oriented to person, place, and time. She appears well-developed and well-nourished.  HENT:  Head: Normocephalic and atraumatic.  Eyes: Pupils are equal, round, and reactive to light. Conjunctivae and EOM are normal.  Neck: Normal range of motion. Neck supple.  Cardiovascular: Normal rate and regular rhythm. Exam reveals no gallop and no friction rub.  No murmur heard. Pulmonary/Chest: Effort normal and breath sounds normal. No respiratory  distress. She has no wheezes. She has no rales. She exhibits no tenderness.  Abdominal: Soft. Bowel sounds are normal. She exhibits no distension and no mass. There is no tenderness. There is no rebound and no guarding.  Musculoskeletal: Normal range of motion. She exhibits no edema or tenderness.  Neurological: She is alert and oriented to person, place, and time.  Skin: Skin is warm and dry.  Psychiatric: She has a normal mood and affect. Her behavior is normal. Judgment and thought content normal.  Nursing note and vitals reviewed.    ED Treatments / Results  Labs (all labs ordered are listed, but only abnormal results are displayed) Labs Reviewed  BASIC METABOLIC PANEL - Abnormal; Notable for the following components:      Result Value   Sodium 134 (*)    CO2 19 (*)    All other components within normal limits  CBC  I-STAT TROPONIN, ED  I-STAT BETA HCG BLOOD, ED ([facility], [facility], [facility] ONLY)    EKG None  Radiology Dg Chest 2 View  Result Date: 01/04/2018 CLINICAL DATA:  Chest pain, fever and dyspnea x4 days. EXAM: CHEST - 2 VIEW COMPARISON:  08/02/2017 FINDINGS: Pulmonary consolidation in the right middle lobe. Left lung is clear. Heart and mediastinal contours are normal. No acute osseous abnormality is seen. IMPRESSION: Right middle lobe pneumonia. Electronically Signed   By: Tollie Eth M.D.   On: 01/04/2018 21:49    Procedures Procedures (including critical care time)  Medications Ordered in ED Medications  albuterol (PROVENTIL) (2.5 MG/3ML) 0.083% nebulizer solution 5 mg (5 mg Nebulization Given 01/04/18 2100)  acetaminophen (TYLENOL) tablet 650 mg (650 mg Oral Given 01/05/18 0012)     Initial Impression / Assessment and Plan / ED Course  I have reviewed the triage vital signs and the nursing notes.  Pertinent labs & imaging results that were available during my care of the patient were reviewed by me and considered in my medical decision making (see chart for details).      Patient presents to the emergency room with chief complaint of shortness of breath, cough, and fever.  Chest x-ray performed in triage shows right middle lobe pneumonia.  This is consistent with the patient's symptoms and physical exam.  She ambulates maintaining greater than 90% pulse oxygenation.  I do not feel that she needs medical admission, and will plan to treat on an outpatient basis.  Return precautions given.  Patient understands and agrees with the plan.  Final Clinical Impressions(s) / ED Diagnoses   Final diagnoses:  Community acquired pneumonia of right middle lobe of lung [facility])    ED Discharge Orders         Ordered    amoxicillin-clavulanate (AUGMENTIN) 875-125 MG tablet  Every 12 hours,   Status:  Discontinued     01/05/18 0457    amoxicillin-clavulanate (AUGMENTIN)  875-125 MG tablet  Every 12 hours     01/05/18 0459           Roxy HorsemanBrowning, Myiesha Edgar, PA-C 01/05/18 Jerilee [id number]    Campos, Kevin, MD 01/06/18 [phone]

## 2018-01-05 NOTE — ED Notes (Signed)
Patient ambulated on room air, SpO2 maintained between 96%-100%.

## 2018-08-24 IMAGING — CR DG CHEST 2V
2 series · 2 of 2 positions shown · non-contrast
Comparison: None.

CLINICAL DATA: Fever, cough.

EXAM:
CHEST - 2 VIEW

[chest pa]
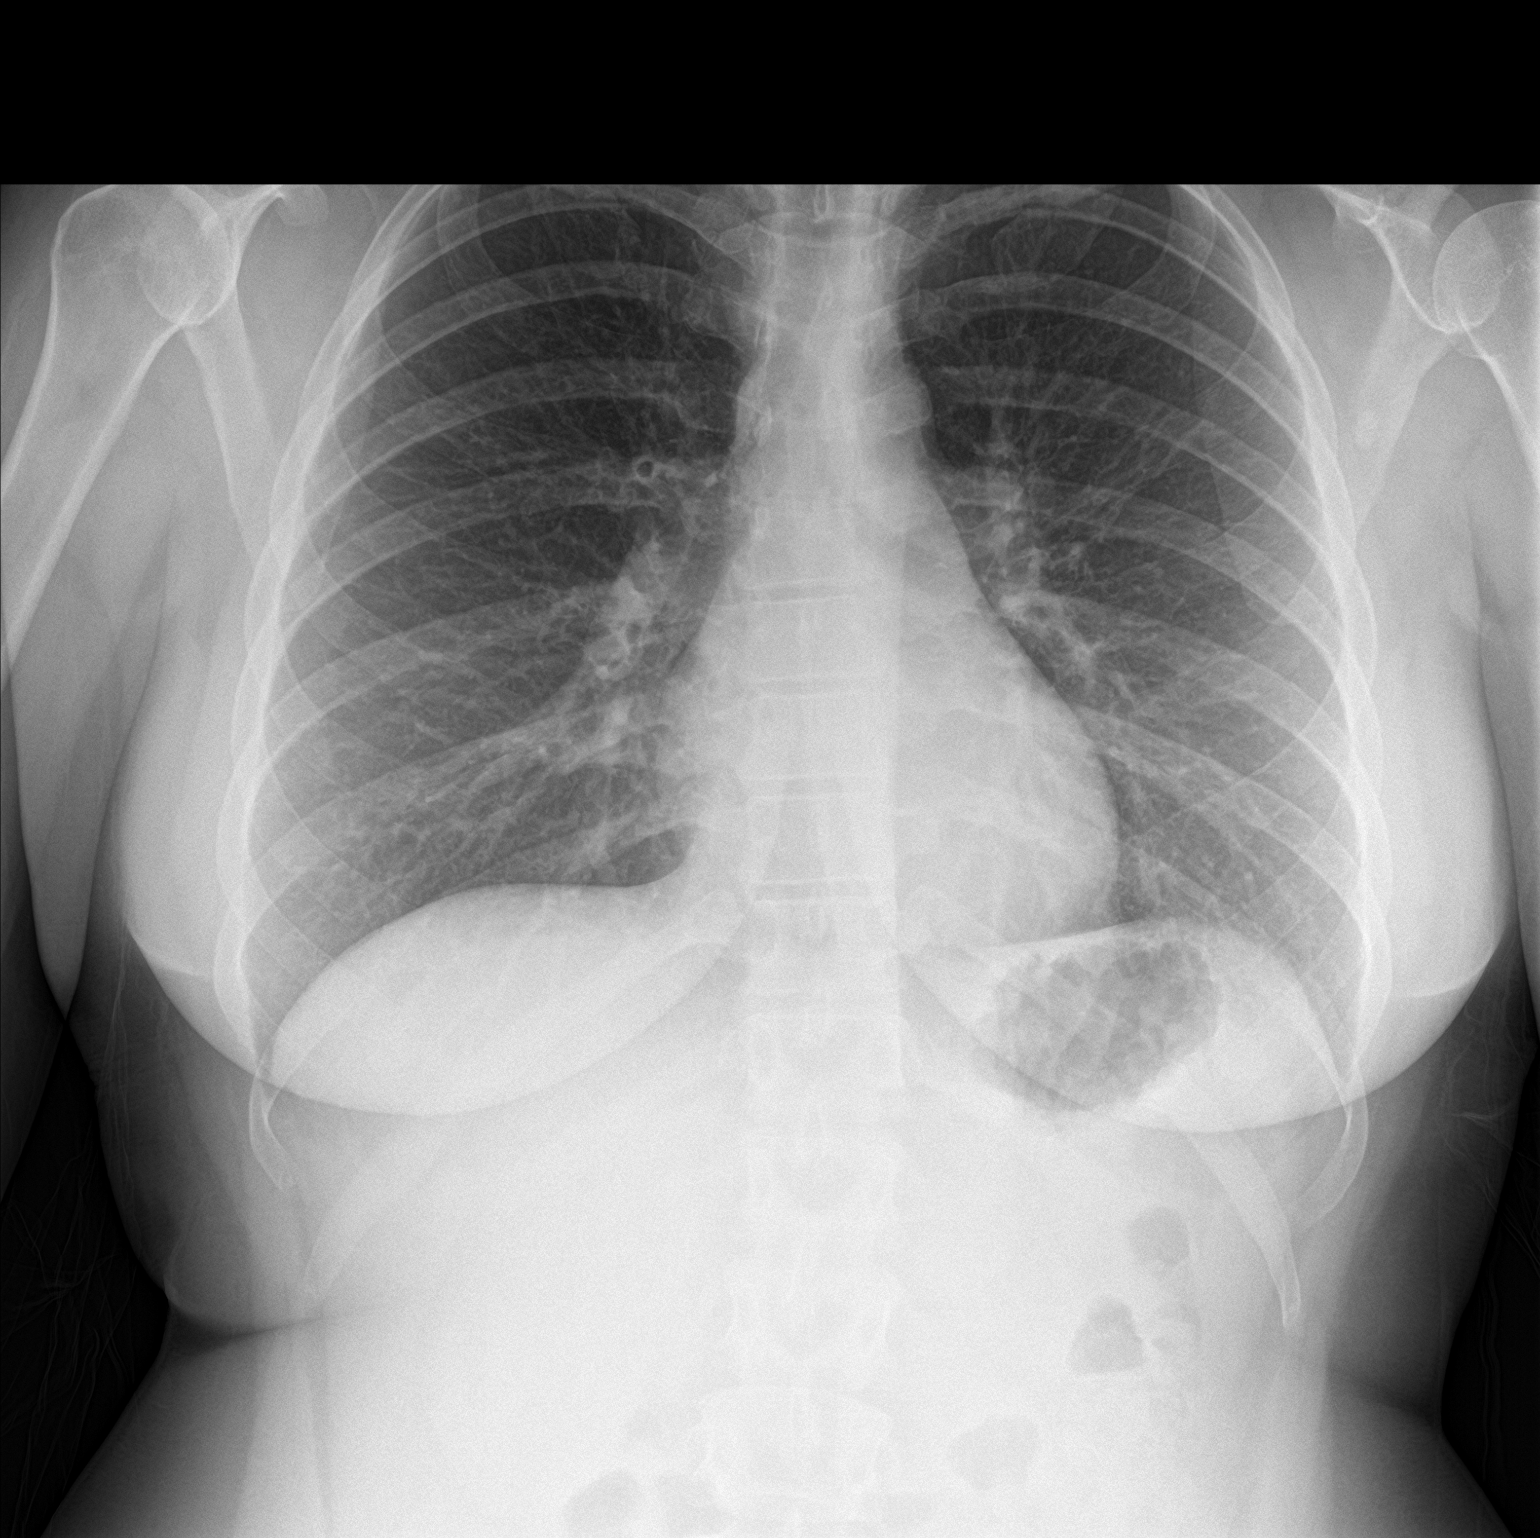

[chest lat]
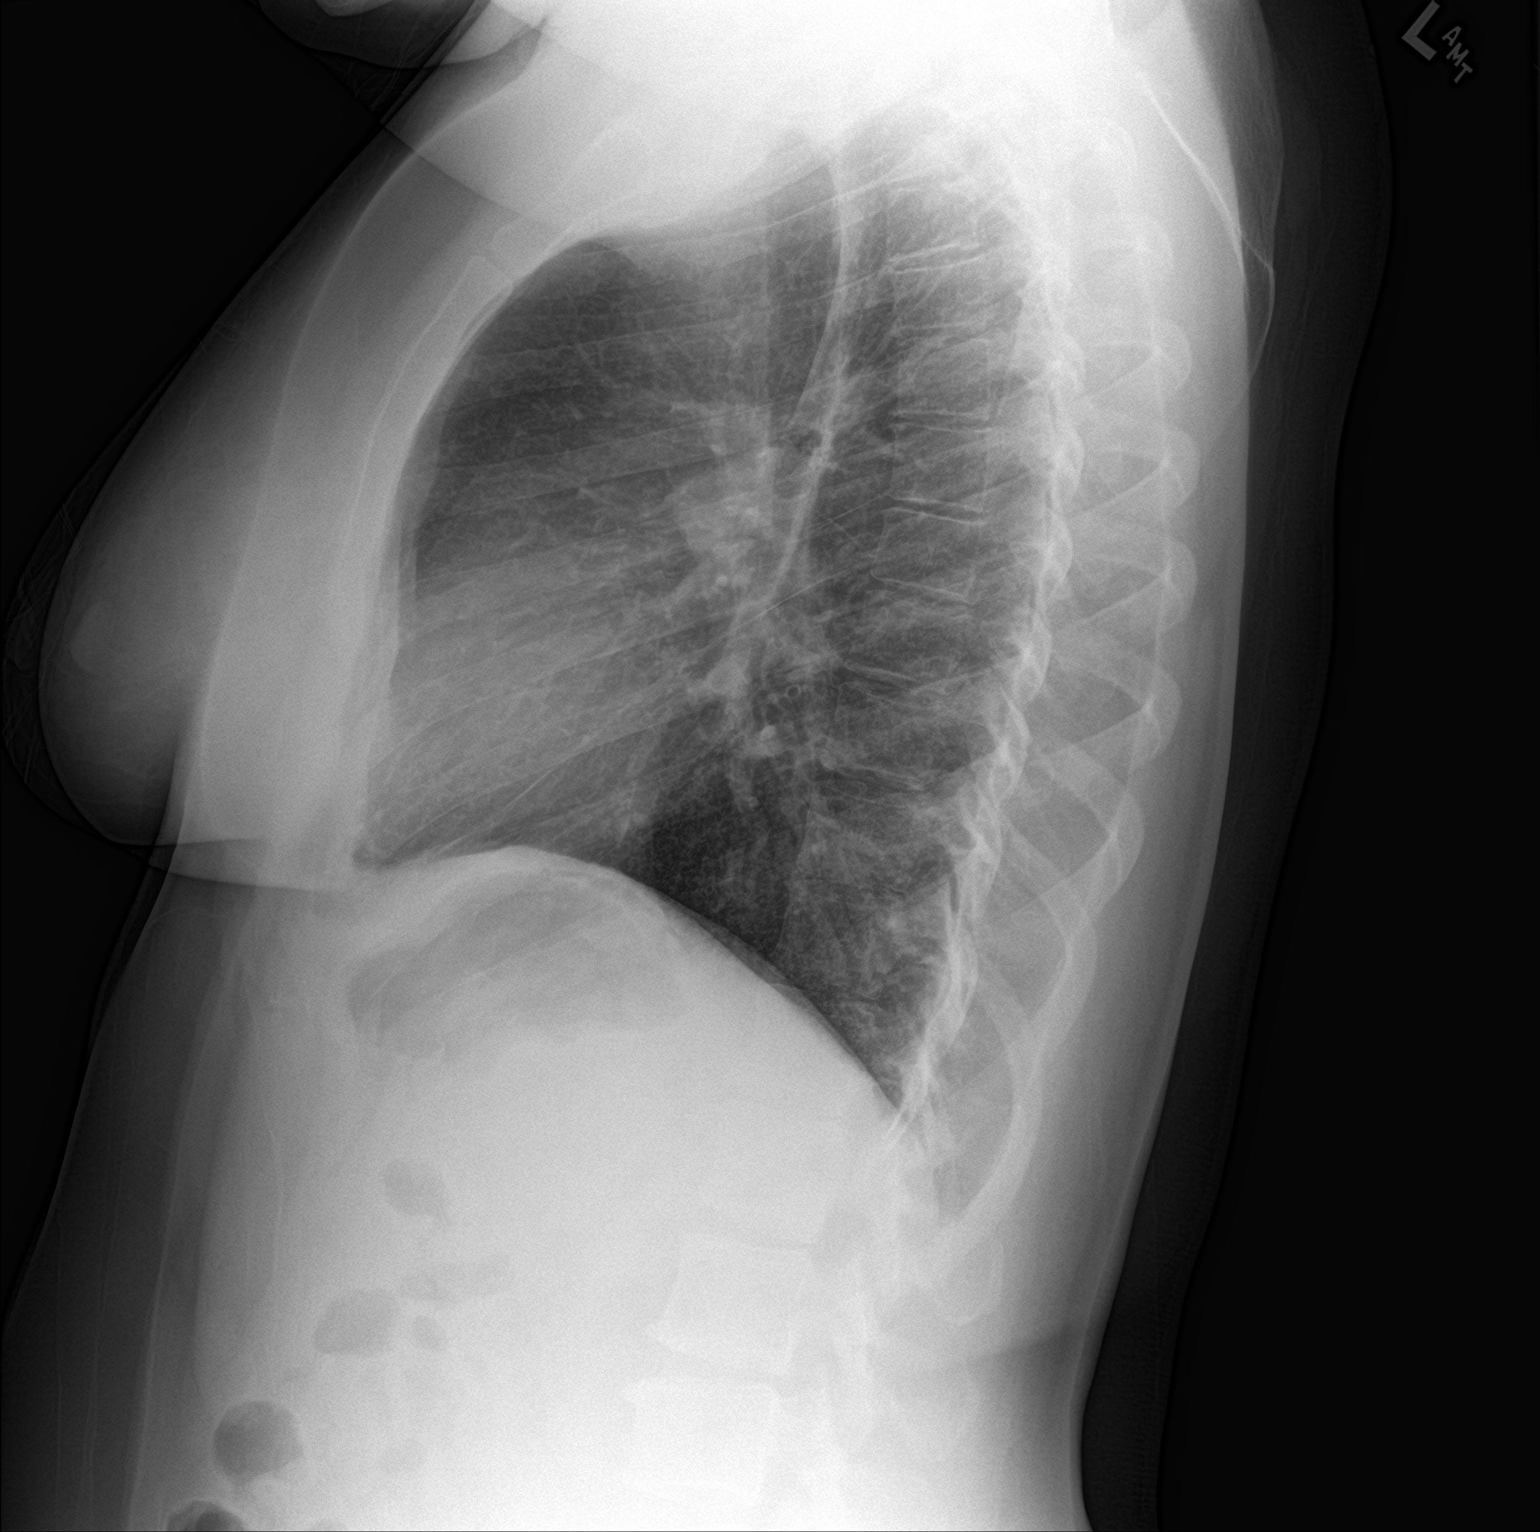

[2 of 2 positions shown; findings below may reference images not displayed]

FINDINGS: The heart size and mediastinal contours are within normal limits.
Both lungs are clear. No pneumothorax or pleural effusion is noted.
The visualized skeletal structures are unremarkable.
IMPRESSION: No active cardiopulmonary disease.

## 2018-09-12 ENCOUNTER — Other Ambulatory Visit (HOSPITAL_COMMUNITY)
Admission: RE | Admit: 2018-09-12 | Discharge: 2018-09-12 | Disposition: A | Discharge status: Home / Self Care | Payer: Medicaid Other | Source: Ambulatory Visit | Attending: Family Medicine | Admitting: Family Medicine

## 2018-09-12 ENCOUNTER — Other Ambulatory Visit: Payer: Self-pay

## 2018-09-12 ENCOUNTER — Ambulatory Visit (INDEPENDENT_AMBULATORY_CARE_PROVIDER_SITE_OTHER): Payer: Medicaid Other | Admitting: Family Medicine

## 2018-09-12 ENCOUNTER — Encounter: Payer: Self-pay | Admitting: Family Medicine

## 2018-09-12 ENCOUNTER — Encounter: Payer: Self-pay | Admitting: *Deleted

## 2018-09-12 ENCOUNTER — Encounter: Payer: Self-pay | Admitting: Radiology

## 2018-09-12 VITALS — BP 113/71 | HR 65 | Wt 209.0 lb

## 2018-09-12 DIAGNOSIS — O09299 Supervision of pregnancy with other poor reproductive or obstetric history, unspecified trimester: Secondary | ICD-10-CM | POA: Insufficient documentation

## 2018-09-12 DIAGNOSIS — O0990 Supervision of high risk pregnancy, unspecified, unspecified trimester: Secondary | ICD-10-CM | POA: Insufficient documentation

## 2018-09-12 DIAGNOSIS — O0991 Supervision of high risk pregnancy, unspecified, first trimester: Secondary | ICD-10-CM

## 2018-09-12 DIAGNOSIS — O09521 Supervision of elderly multigravida, first trimester: Secondary | ICD-10-CM

## 2018-09-12 DIAGNOSIS — O09291 Supervision of pregnancy with other poor reproductive or obstetric history, first trimester: Secondary | ICD-10-CM

## 2018-09-12 DIAGNOSIS — O09529 Supervision of elderly multigravida, unspecified trimester: Secondary | ICD-10-CM | POA: Insufficient documentation

## 2018-09-12 DIAGNOSIS — Z3201 Encounter for pregnancy test, result positive: Secondary | ICD-10-CM | POA: Diagnosis not present

## 2018-09-12 DIAGNOSIS — Z3A12 12 weeks gestation of pregnancy: Secondary | ICD-10-CM | POA: Diagnosis not present

## 2018-09-12 DIAGNOSIS — O34219 Maternal care for unspecified type scar from previous cesarean delivery: Secondary | ICD-10-CM | POA: Insufficient documentation

## 2018-09-12 LAB — POCT URINE PREGNANCY: Preg Test, Ur: POSITIVE — AB

## 2018-09-12 MED ORDER — ASPIRIN EC 81 MG PO TBEC
81.0000 mg | DELAYED_RELEASE_TABLET | Freq: Every day | ORAL | 2 refills | Status: DC
Start: 2018-09-12 — End: 2019-03-20

## 2018-09-12 NOTE — Patient Instructions (Signed)
Pregnancy After Age 35  Women who become pregnant after the age of 35 have a higher risk for certain problems during pregnancy. This is because older women may already have health problems before becoming pregnant. Older women who are healthy before pregnancy may still develop problems during pregnancy. These problems may affect the mother, the unborn baby (fetus), or both.  What are the risks for me?  If you are over age 35 and you want to become pregnant or are pregnant, you may have a higher risk of:   Not being able to get pregnant (infertility).   Going into labor early (preterm labor).   Needing surgical delivery of your baby (cesarean delivery, or C-section).   Having high blood pressure (hypertension).   Having complications during pregnancy, such as high blood pressure and other symptoms (preeclampsia).   Having diabetes during pregnancy (gestational diabetes).   Being pregnant with more than one baby.   Loss of the unborn baby before 20 weeks (miscarriage) or after 20 weeks of pregnancy (stillbirth).  What are the risks for my baby?  Babies born to women over the age of 35 have a higher risk for:   Being born early (prematurity).   Low birth weight, which is less than 5 lb, 8 oz (2.5 kg).   Birth defects, such as Down syndrome and cleft palate.   Health complications, including problems with growth and development.  How is prenatal care different for women over age 35?  All women should see their health care provider before they try to become pregnant. This is especially important for women over the age of 35. Tell your health care provider about:   Any health problems you have.   Any medicines you take.   Any family history of health problems or chromosome-related defects.   Any problems you have had with past pregnancies or deliveries.  If you are over age 35 and you plan to become pregnant:   Start taking a daily multivitamin a month or more before you try to get pregnant. Your  multivitamin should contain 400 mcg (micrograms) of folic acid.  If you are over age 35 and pregnant, make sure you:   Keep taking your multivitamin unless your health care provider tells you not to take it.   Keep all prenatal visits as told by your health care provider. This is important.   Have ultrasounds regularly throughout your pregnancy to check for problems.   Talk with your health care provider about other prenatal screening tests that you may need.  What additional prenatal tests are needed?  Screening tests show whether your baby has a higher risk for birth defects than other babies. Screening tests include:   Ultrasound tests to look for markers that indicate a risk for birth defects.   Maternal blood screening. These are blood tests that measure certain substances in your blood to determine your baby's risk for defects.  Screening tests do not show whether your baby has or does not have defects. They only show your baby's risk for certain defects. If your screening tests show that risk factors are present, you may need tests to confirm the defect (diagnostic testing). These tests may include:   Chorionic villus sampling. For this procedure, a tissue sample is taken from the organ that forms in your uterus to nourish your baby (placenta). The sample is removed through your cervix or abdomen and tested.   Amniocentesis. For this procedure, a small amount of the fluid that surrounds   the baby in the uterus (amniotic fluid) is removed and tested.  What can I do to stay healthy during my pregnancy?  Staying healthy during pregnancy can help you and your baby to have a lower risk for problems during pregnancy, during delivery, or both. Talk with your health care provider for specific instructions about staying healthy during your pregnancy.  Nutrition     At each meal, eat a variety of foods from each of the five food groups. These groups include:  ? Proteins such as lean meats, poultry, fish that is  low in fat, beans, eggs, and nuts.  ? Vegetables such as leafy greens, raw and cooked vegetables, and vegetable juice.  ? Fruits that are fresh, frozen, or canned, or 100% fruit juice.  ? Dairy products such as low-fat yogurt, cheese, and milk.  ? Whole grains including rice, cereal, pasta, and bread.   Talk with your health care provider about how much food in each group is right for you.   Follow instructions from your health care provider about eating and drinking restrictions during pregnancy.  ? Do not eat raw eggs, raw meat, or raw fish or seafood.  ? Do not eat any fish that contains high amounts of mercury, such as swordfish or mackerel.   Drink 6-8 or more glasses of water a day. You should drink enough fluid to keep your urine pale yellow.  Managing weight gain   Ask your health care provider how much weight gain is healthy during pregnancy.   Stay at a healthy weight. If needed, work with your health care provider to lose weight safely.  Activity   Exercise regularly, as directed by your health care provider. Ask your health care provider what forms of exercise are safe for you.  General instructions   Do not use any products that contain nicotine or tobacco, such as cigarettes and e-cigarettes. If you need help quitting, ask your health care provider.   Do not drink alcohol, use drugs, or abuse prescription medicine.   Take over-the-counter and prescription medicines only as told by your health care provider.   Do not use hot tubs, steam rooms, or saunas.   Talk with your health care provider about your risk of exposure to harmful environmental conditions. This includes exposure to chemicals, radiation, cleaning products, and cat feces. Follow advice from your health care provider about how to limit your exposure.  Summary   Women who become pregnant after the age of 35 have a higher risk for complications during pregnancy.   Problems may affect the mother, the unborn baby (fetus), or  both.   All women should see their health care provider before they try to become pregnant. This is especially important for women over the age of 35.   Staying healthy during pregnancy can help both you and your baby to have a lower risk for some of the problems that can happen during pregnancy, during delivery, or both.  This information is not intended to replace advice given to you by your health care provider. Make sure you discuss any questions you have with your health care provider.  Document Released: 09/05/2016 Document Revised: 09/05/2016 Document Reviewed: 09/05/2016  Elsevier Interactive Patient Education  2019 Elsevier Inc.

## 2018-09-12 NOTE — Progress Notes (Signed)
   PRENATAL VISIT NOTE  Subjective:  Hannah Hamilton is a 38 y.o. G3P2002 at [redacted]w[redacted]d being seen today for ongoing prenatal care.  She is currently monitored for the following issues for this high-risk pregnancy and has Supervision of high risk pregnancy, antepartum; Hx of preeclampsia, prior pregnancy, currently pregnant; Advanced maternal age in multigravida; and Previous cesarean delivery, antepartum on their problem list.  Patient reports no complaints.  Contractions: Regular. Vag. Bleeding: None.   . Denies leaking of fluid.   The following portions of the patient's history were reviewed and updated as appropriate: allergies, current medications, past family history, past medical history, past social history, past surgical history and problem list.   Objective:   Vitals:   09/12/18 0917  BP: 113/71  Pulse: 65  Weight: 209 lb (94.8 kg)    Fetal Status: Fetal Heart Rate (bpm): 146         General:  Alert, oriented and cooperative. Patient is in no acute distress.  Skin: Skin is warm and dry. No rash noted.   Cardiovascular: Normal heart rate noted  Respiratory: Normal respiratory effort, no problems with respiration noted  Abdomen: Soft, gravid, appropriate for gestational age.  Pain/Pressure: Absent     Pelvic: Cervical exam deferred        Extremities: Normal range of motion.  Edema: None  Mental Status: Normal mood and affect. Normal behavior. Normal judgment and thought content.   Assessment and Plan:  Pregnancy: G3P2002 at [redacted]w[redacted]d  1. Supervision of high risk pregnancy, antepartum Reviewed nature of practice with CNM,  - Cytology - PAP - Obstetric Panel, Including HIV - Culture, OB Urine - Genetic Screening - Enroll Patient in Babyscripts - Babyscripts Schedule Optimization - POCT urine pregnancy - [place] MFM OB COMP + 14 WK; Future  2. Hx of preeclampsia, prior pregnancy, currently pregnant - aspirin EC 81 MG tablet; Take 1 tablet (81 mg total) by mouth daily. Take after 12  weeks for prevention of preeclampsia later in pregnancy  Dispense: 300 tablet; Refill: 2  3. Multigravida of advanced maternal age in first trimester NIPT  4. Previous cesarean delivery, antepartum Plan for repeat     Preterm labor symptoms and general obstetric precautions including but not limited to vaginal bleeding, contractions, leaking of fluid and fetal movement were reviewed in detail with the patient. Please refer to After Visit Summary for other counseling recommendations.   Return in about 6 weeks (around 10/24/2018) for Routine prenatal care- 20 wk visit.  Future Appointments  Date Time Provider Department Center  10/17/2018  8:15 AM [facility] [place] 4 [facility]  10/24/2018  9:00 AM Federico Flake, MD [facility]    Federico Flake, MD

## 2018-09-12 NOTE — Progress Notes (Addendum)
Last pap was at least 3 years ago was normal

## 2018-09-13 ENCOUNTER — Other Ambulatory Visit: Payer: Self-pay | Admitting: *Deleted

## 2018-09-13 LAB — OBSTETRIC PANEL, INCLUDING HIV
Antibody Screen: NEGATIVE
Basophils Absolute: 0 10*3/uL (ref 0.0–0.2)
Basos: 0 %
EOS (ABSOLUTE): 0.1 10*3/uL (ref 0.0–0.4)
Eos: 1 %
HIV Screen 4th Generation wRfx: NONREACTIVE
Hematocrit: 38.8 % (ref 34.0–46.6)
Hemoglobin: 13.8 g/dL (ref 11.1–15.9)
Hepatitis B Surface Ag: NEGATIVE
Immature Grans (Abs): 0.1 10*3/uL (ref 0.0–0.1)
Immature Granulocytes: 1 %
Lymphocytes Absolute: 2.7 10*3/uL (ref 0.7–3.1)
Lymphs: 27 %
MCH: 31.7 pg (ref 26.6–33.0)
MCHC: 35.6 g/dL (ref 31.5–35.7)
MCV: 89 fL (ref 79–97)
Monocytes Absolute: 0.6 10*3/uL (ref 0.1–0.9)
Monocytes: 6 %
Neutrophils Absolute: 6.5 10*3/uL (ref 1.4–7.0)
Neutrophils: 65 %
Platelets: 267 10*3/uL (ref 150–450)
RBC: 4.35 x10E6/uL (ref 3.77–5.28)
RDW: 11.8 % (ref 11.7–15.4)
RPR Ser Ql: NONREACTIVE
Rh Factor: POSITIVE
Rubella Antibodies, IGG: 15 index (ref >0.99)
WBC: 10 10*3/uL (ref 3.4–10.8)

## 2018-09-13 MED ORDER — PREPLUS 27-1 MG PO TABS: 1.0000 | ORAL_TABLET | Freq: Every day | ORAL | 13 refills | Status: DC

## 2018-09-14 LAB — URINE CULTURE, OB REFLEX

## 2018-09-14 LAB — CULTURE, OB URINE

## 2018-09-15 LAB — CYTOLOGY - PAP
Chlamydia: NEGATIVE
Diagnosis: NEGATIVE
HPV: NOT DETECTED
Neisseria Gonorrhea: NEGATIVE

## 2018-09-18 ENCOUNTER — Encounter: Payer: Self-pay | Admitting: Radiology

## 2018-09-18 ENCOUNTER — Telehealth: Payer: Self-pay | Admitting: Radiology

## 2018-09-18 NOTE — Telephone Encounter (Signed)
Spoke patient about Panorama results, she is coming to office to pick up results.

## 2018-10-05 ENCOUNTER — Encounter: Payer: Self-pay | Admitting: Radiology

## 2018-10-17 ENCOUNTER — Other Ambulatory Visit: Payer: Self-pay | Admitting: Family Medicine

## 2018-10-17 ENCOUNTER — Other Ambulatory Visit: Payer: Self-pay

## 2018-10-17 ENCOUNTER — Ambulatory Visit (HOSPITAL_COMMUNITY)
Admission: RE | Admit: 2018-10-17 | Discharge: 2018-10-17 | Disposition: A | Discharge status: Home / Self Care | Payer: Medicaid Other | Source: Ambulatory Visit | Attending: Obstetrics and Gynecology | Admitting: Obstetrics and Gynecology

## 2018-10-17 ENCOUNTER — Other Ambulatory Visit (HOSPITAL_COMMUNITY): Payer: Self-pay | Admitting: *Deleted

## 2018-10-17 DIAGNOSIS — O34219 Maternal care for unspecified type scar from previous cesarean delivery: Secondary | ICD-10-CM

## 2018-10-17 DIAGNOSIS — O09522 Supervision of elderly multigravida, second trimester: Secondary | ICD-10-CM

## 2018-10-17 DIAGNOSIS — O09292 Supervision of pregnancy with other poor reproductive or obstetric history, second trimester: Secondary | ICD-10-CM | POA: Diagnosis not present

## 2018-10-17 DIAGNOSIS — O0990 Supervision of high risk pregnancy, unspecified, unspecified trimester: Secondary | ICD-10-CM

## 2018-10-17 DIAGNOSIS — Z362 Encounter for other antenatal screening follow-up: Secondary | ICD-10-CM

## 2018-10-17 DIAGNOSIS — Z3A17 17 weeks gestation of pregnancy: Secondary | ICD-10-CM | POA: Diagnosis not present

## 2018-10-17 DIAGNOSIS — O3412 Maternal care for benign tumor of corpus uteri, second trimester: Secondary | ICD-10-CM | POA: Diagnosis not present

## 2018-10-24 ENCOUNTER — Other Ambulatory Visit: Payer: Self-pay

## 2018-10-24 ENCOUNTER — Ambulatory Visit (INDEPENDENT_AMBULATORY_CARE_PROVIDER_SITE_OTHER): Payer: Medicaid Other | Admitting: Family Medicine

## 2018-10-24 VITALS — BP 112/71 | HR 76 | Wt 209.0 lb

## 2018-10-24 DIAGNOSIS — Z3A18 18 weeks gestation of pregnancy: Secondary | ICD-10-CM

## 2018-10-24 DIAGNOSIS — O34219 Maternal care for unspecified type scar from previous cesarean delivery: Secondary | ICD-10-CM

## 2018-10-24 DIAGNOSIS — O26899 Other specified pregnancy related conditions, unspecified trimester: Secondary | ICD-10-CM | POA: Insufficient documentation

## 2018-10-24 DIAGNOSIS — O0990 Supervision of high risk pregnancy, unspecified, unspecified trimester: Secondary | ICD-10-CM

## 2018-10-24 DIAGNOSIS — O09522 Supervision of elderly multigravida, second trimester: Secondary | ICD-10-CM

## 2018-10-24 DIAGNOSIS — O09292 Supervision of pregnancy with other poor reproductive or obstetric history, second trimester: Secondary | ICD-10-CM

## 2018-10-24 DIAGNOSIS — O26892 Other specified pregnancy related conditions, second trimester: Secondary | ICD-10-CM

## 2018-10-24 DIAGNOSIS — O09299 Supervision of pregnancy with other poor reproductive or obstetric history, unspecified trimester: Secondary | ICD-10-CM

## 2018-10-24 DIAGNOSIS — O0992 Supervision of high risk pregnancy, unspecified, second trimester: Secondary | ICD-10-CM

## 2018-10-24 DIAGNOSIS — G5600 Carpal tunnel syndrome, unspecified upper limb: Secondary | ICD-10-CM | POA: Insufficient documentation

## 2018-10-24 NOTE — Progress Notes (Signed)
   PRENATAL VISIT NOTE  Subjective:  Hannah Hamilton is a 38 y.o. G3P2002 at [redacted]w[redacted]d being seen today for ongoing prenatal care.  She is currently monitored for the following issues for this high-risk pregnancy and has Supervision of high risk pregnancy, antepartum; Hx of preeclampsia, prior pregnancy, currently pregnant; Advanced maternal age in multigravida; Previous cesarean delivery, antepartum; and Carpal tunnel syndrome during pregnancy on their problem list.  Patient reports no complaints.  Contractions: Not present. Vag. Bleeding: None.  Movement: Present. Denies leaking of fluid.   The following portions of the patient's history were reviewed and updated as appropriate: allergies, current medications, past family history, past medical history, past social history, past surgical history and problem list.   Objective:   Vitals:   10/24/18 0910  BP: 112/71  Pulse: 76  Weight: 209 lb (94.8 kg)    Fetal Status: Fetal Heart Rate (bpm): 128 Fundal Height: 20 cm Movement: Present     General:  Alert, oriented and cooperative. Patient is in no acute distress.  Skin: Skin is warm and dry. No rash noted.   Cardiovascular: Normal heart rate noted  Respiratory: Normal respiratory effort, no problems with respiration noted  Abdomen: Soft, gravid, appropriate for gestational age.  Pain/Pressure: Absent     Pelvic: Cervical exam deferred        Extremities: Normal range of motion.  Edema: None  Mental Status: Normal mood and affect. Normal behavior. Normal judgment and thought content.   Assessment and Plan:  Pregnancy: G3P2002 at [redacted]w[redacted]d  1. Supervision of high risk pregnancy, antepartum Up to date, had repeat US scheduled  2. Hx of preeclampsia, prior pregnancy, currently pregnant Taking ASA  3. Multigravida of advanced maternal age in second trimester Low risk screenings  4. Previous cesarean delivery, antepartum Will have repeat CS with btl  5. Carpal tunnel syndrome during  pregnancy Reviewed use of wrist splints   Preterm labor symptoms and general obstetric precautions including but not limited to vaginal bleeding, contractions, leaking of fluid and fetal movement were reviewed in detail with the patient. Please refer to After Visit Summary for other counseling recommendations.   Return in about 4 weeks (around 11/21/2018) for Routine prenatal care - My chart/telehealth.  Future Appointments  Date Time Provider Department Center  11/21/2018  1:15 PM Federico Flake, MD [facility]  11/28/2018  8:15 AM [facility] [place] 4 [facility]    Federico Flake, MD

## 2018-10-24 NOTE — Progress Notes (Signed)
Pt states hands go numb

## 2018-10-24 NOTE — Patient Instructions (Signed)

## 2018-11-21 ENCOUNTER — Telehealth: Payer: Self-pay | Admitting: Radiology

## 2018-11-21 ENCOUNTER — Telehealth (INDEPENDENT_AMBULATORY_CARE_PROVIDER_SITE_OTHER): Payer: Medicaid Other | Admitting: Family Medicine

## 2018-11-21 DIAGNOSIS — O09299 Supervision of pregnancy with other poor reproductive or obstetric history, unspecified trimester: Secondary | ICD-10-CM

## 2018-11-21 DIAGNOSIS — O26892 Other specified pregnancy related conditions, second trimester: Secondary | ICD-10-CM

## 2018-11-21 DIAGNOSIS — G5600 Carpal tunnel syndrome, unspecified upper limb: Secondary | ICD-10-CM

## 2018-11-21 DIAGNOSIS — O09292 Supervision of pregnancy with other poor reproductive or obstetric history, second trimester: Secondary | ICD-10-CM

## 2018-11-21 DIAGNOSIS — O0990 Supervision of high risk pregnancy, unspecified, unspecified trimester: Secondary | ICD-10-CM

## 2018-11-21 DIAGNOSIS — O0992 Supervision of high risk pregnancy, unspecified, second trimester: Secondary | ICD-10-CM

## 2018-11-21 DIAGNOSIS — Z3A22 22 weeks gestation of pregnancy: Secondary | ICD-10-CM

## 2018-11-21 DIAGNOSIS — O34219 Maternal care for unspecified type scar from previous cesarean delivery: Secondary | ICD-10-CM

## 2018-11-21 DIAGNOSIS — O09522 Supervision of elderly multigravida, second trimester: Secondary | ICD-10-CM

## 2018-11-21 NOTE — Progress Notes (Signed)
   TELEHEALTH OBSTETRICS PRENATAL VIRTUAL VIDEO VISIT ENCOUNTER NOTE  Provider location: Center for [facility] at [facility]   I connected with Baldo Ash on 11/21/18 at  1:15 PM EDT by MyChart Video Encounter at home and verified that I am speaking with the correct person using two identifiers.   I discussed the limitations, risks, security and privacy concerns of performing an evaluation and management service by telephone and the availability of in person appointments. I also discussed with the patient that there may be a patient responsible charge related to this service. The patient expressed understanding and agreed to proceed. Subjective:  Hannah Hamilton is a 38 y.o. G3P2002 at [redacted]w[redacted]d being seen today for ongoing prenatal care.  She is currently monitored for the following issues for this high-risk pregnancy and has Supervision of high risk pregnancy, antepartum; Hx of preeclampsia, prior pregnancy, currently pregnant; Advanced maternal age in multigravida; Previous cesarean delivery, antepartum; and Carpal tunnel syndrome during pregnancy on their problem list.  Patient reports no complaints and numbness in hand, R>L, goes to sleep at night.  Contractions: Not present. Vag. Bleeding: None.  Movement: Present. Denies any leaking of fluid.   The following portions of the patient's history were reviewed and updated as appropriate: allergies, current medications, past family history, past medical history, past social history, past surgical history and problem list.   Objective:  There were no vitals filed for this visit.  Fetal Status:     Movement: Present     General:  Alert, oriented and cooperative. Patient is in no acute distress.  Respiratory: Normal respiratory effort, no problems with respiration noted  Mental Status: Normal mood and affect. Normal behavior. Normal judgment and thought content.  Rest of physical exam deferred due to type of encounter  Imaging: No results  found.  Assessment and Plan:  Pregnancy: G3P2002 at [redacted]w[redacted]d 1. Supervision of high risk pregnancy, antepartum Continue prenatal care.   2. Hx of preeclampsia, prior pregnancy, currently pregnant Continue ASA, Bp reviewed in BabyScripts  3. Previous cesarean delivery, antepartum For RCS  4. Carpal tunnel syndrome during pregnancy Cock-up wrist splints advised for night time  5. Multigravida of advanced maternal age in second trimester Low risk NIPT  Preterm labor symptoms and general obstetric precautions including but not limited to vaginal bleeding, contractions, leaking of fluid and fetal movement were reviewed in detail with the patient. I discussed the assessment and treatment plan with the patient. The patient was provided an opportunity to ask questions and all were answered. The patient agreed with the plan and demonstrated an understanding of the instructions. The patient was advised to call back or seek an in-person office evaluation/go to MAU at [facility] for any urgent or concerning symptoms. Please refer to After Visit Summary for other counseling recommendations.   I provided 8 minutes of face-to-face time during this encounter.  Return in 4 weeks (on 12/19/2018) for virtual.  Future Appointments  Date Time Provider [place]  11/28/2018  8:15 AM [facility] [place], [place] for [facility], [place]

## 2018-11-21 NOTE — Telephone Encounter (Signed)
Left message to call [facility] to schedule virtual visit for the week of July 20th

## 2018-11-21 NOTE — Patient Instructions (Signed)

## 2018-11-28 ENCOUNTER — Ambulatory Visit (HOSPITAL_COMMUNITY)
Admission: RE | Admit: 2018-11-28 | Discharge: 2018-11-28 | Disposition: A | Discharge status: Home / Self Care | Payer: Medicaid Other | Source: Ambulatory Visit | Attending: Maternal & Fetal Medicine | Admitting: Maternal & Fetal Medicine

## 2018-11-28 ENCOUNTER — Other Ambulatory Visit: Payer: Self-pay

## 2018-11-28 ENCOUNTER — Other Ambulatory Visit (HOSPITAL_COMMUNITY): Payer: Self-pay | Admitting: *Deleted

## 2018-11-28 DIAGNOSIS — O09292 Supervision of pregnancy with other poor reproductive or obstetric history, second trimester: Secondary | ICD-10-CM | POA: Diagnosis not present

## 2018-11-28 DIAGNOSIS — O99213 Obesity complicating pregnancy, third trimester: Secondary | ICD-10-CM

## 2018-11-28 DIAGNOSIS — O99212 Obesity complicating pregnancy, second trimester: Secondary | ICD-10-CM

## 2018-11-28 DIAGNOSIS — O3412 Maternal care for benign tumor of corpus uteri, second trimester: Secondary | ICD-10-CM

## 2018-11-28 DIAGNOSIS — O34219 Maternal care for unspecified type scar from previous cesarean delivery: Secondary | ICD-10-CM | POA: Diagnosis not present

## 2018-11-28 DIAGNOSIS — Z3A23 23 weeks gestation of pregnancy: Secondary | ICD-10-CM | POA: Diagnosis not present

## 2018-11-28 DIAGNOSIS — Z362 Encounter for other antenatal screening follow-up: Secondary | ICD-10-CM | POA: Diagnosis not present

## 2018-12-18 ENCOUNTER — Other Ambulatory Visit: Payer: Self-pay

## 2018-12-18 ENCOUNTER — Telehealth (INDEPENDENT_AMBULATORY_CARE_PROVIDER_SITE_OTHER): Payer: Medicaid Other | Admitting: Obstetrics and Gynecology

## 2018-12-18 VITALS — BP 107/73

## 2018-12-18 DIAGNOSIS — O34219 Maternal care for unspecified type scar from previous cesarean delivery: Secondary | ICD-10-CM | POA: Diagnosis not present

## 2018-12-18 DIAGNOSIS — O0990 Supervision of high risk pregnancy, unspecified, unspecified trimester: Secondary | ICD-10-CM

## 2018-12-18 DIAGNOSIS — O26899 Other specified pregnancy related conditions, unspecified trimester: Secondary | ICD-10-CM

## 2018-12-18 DIAGNOSIS — O0992 Supervision of high risk pregnancy, unspecified, second trimester: Secondary | ICD-10-CM

## 2018-12-18 DIAGNOSIS — O09292 Supervision of pregnancy with other poor reproductive or obstetric history, second trimester: Secondary | ICD-10-CM

## 2018-12-18 DIAGNOSIS — O09522 Supervision of elderly multigravida, second trimester: Secondary | ICD-10-CM

## 2018-12-18 DIAGNOSIS — G5600 Carpal tunnel syndrome, unspecified upper limb: Secondary | ICD-10-CM | POA: Diagnosis not present

## 2018-12-18 DIAGNOSIS — O26892 Other specified pregnancy related conditions, second trimester: Secondary | ICD-10-CM | POA: Diagnosis not present

## 2018-12-18 DIAGNOSIS — O09299 Supervision of pregnancy with other poor reproductive or obstetric history, unspecified trimester: Secondary | ICD-10-CM

## 2018-12-18 DIAGNOSIS — Z3A26 26 weeks gestation of pregnancy: Secondary | ICD-10-CM

## 2018-12-18 NOTE — Progress Notes (Signed)
   TELEHEALTH VIRTUAL OBSTETRICS VISIT ENCOUNTER NOTE  Clinic: [facility]  I connected with Hannah Hamilton on 12/18/18 at 11:15 AM EDT by telephone at home and verified that I am speaking with the correct person using two identifiers.   I discussed the limitations, risks, security and privacy concerns of performing an evaluation and management service by telephone and the availability of in person appointments. I also discussed with the patient that there may be a patient responsible charge related to this service. The patient expressed understanding and agreed to proceed.  Subjective:  Hannah Hamilton is a 38 y.o. G3P2002 at [redacted]w[redacted]d being followed for ongoing prenatal care.  She is currently monitored for the following issues for this high-risk pregnancy and has Supervision of high risk pregnancy, antepartum; Hx of preeclampsia, prior pregnancy, currently pregnant; Advanced maternal age in multigravida; Previous cesarean delivery, antepartum; and Carpal tunnel syndrome during pregnancy on their problem list.  Patient reports no complaints. Reports fetal movement. Denies any contractions, bleeding or leaking of fluid.   The following portions of the patient's history were reviewed and updated as appropriate: allergies, current medications, past family history, past medical history, past social history, past surgical history and problem list.   Objective:   Vitals:   12/18/18 1104  BP: 107/73    Babyscripts Data Reviewed: yes  General:  Alert, oriented and cooperative.   Mental Status: Normal mood and affect perceived. Normal judgment and thought content.  Rest of physical exam deferred due to type of encounter  Assessment and Plan:  Pregnancy: G3P2002 at [redacted]w[redacted]d 1. Carpal tunnel syndrome during pregnancy No issues  2. Hx of preeclampsia, prior pregnancy, currently pregnant conitnue low dose asa  3. Multigravida of advanced maternal age in second trimester No issues -  Glucose Tolerance, 2 Hours w/1 Hour; Future - CBC; Future - RPR; Future - HIV antibody (with reflex); Future  4. Previous cesarean delivery, antepartum Schedule rpt nv. Ask about btl, sign papers prn  5. Supervision of high risk pregnancy, antepartum 28wk labs nv  Preterm labor symptoms and general obstetric precautions including but not limited to vaginal bleeding, contractions, leaking of fluid and fetal movement were reviewed in detail with the patient.  I discussed the assessment and treatment plan with the patient. The patient was provided an opportunity to ask questions and all were answered. The patient agreed with the plan and demonstrated an understanding of the instructions. The patient was advised to call back or seek an in-person office evaluation/go to MAU at [facility] for any urgent or concerning symptoms. Please refer to After Visit Summary for other counseling recommendations.   I provided 7 minutes of non-face-to-face time during this encounter. The visit was conducted via [place]  Return in about 15 days (around 01/02/2019).  Future Appointments  Date Time Provider [place]  01/02/2019  8:15 AM Darlina Rumpf, CNM [facility]  02/06/2019  8:30 AM [facility] NURSE [place] [facility]-US  02/06/2019  8:30 AM [place] 1 [facility]    Aletha Halim, [place] for [employer] [facility], [place]

## 2018-12-18 NOTE — Progress Notes (Signed)
I connected with  Andee Oquinn on 12/18/18 at 11:15 AM EDT by telephone and verified that I am speaking with the correct person using two identifiers.   I discussed the limitations, risks, security and privacy concerns of performing an evaluation and management service by telephone and the availability of in person appointments. I also discussed with the patient that there may be a patient responsible charge related to this service. The patient expressed understanding and agreed to proceed.  Crosby Oyster, RN 12/18/2018  11:05 AM

## 2018-12-31 ENCOUNTER — Telehealth: Payer: Self-pay | Admitting: *Deleted

## 2018-12-31 NOTE — Telephone Encounter (Signed)
Pt called stating she is having a lot of pelvic pressure an left lower back pain, pt denies and vaginal bleeding, leaking any fluid, urinary frequency or urgency. Pt doe shave appointment this Wednesday. Will have pt come in to give urine sample to rule out UTI and then follow up with provider at appointment. Pt verbalizes and understands POC

## 2019-01-02 ENCOUNTER — Encounter: Payer: Self-pay | Admitting: *Deleted

## 2019-01-02 ENCOUNTER — Ambulatory Visit (INDEPENDENT_AMBULATORY_CARE_PROVIDER_SITE_OTHER): Payer: Medicaid Other | Admitting: Advanced Practice Midwife

## 2019-01-02 ENCOUNTER — Other Ambulatory Visit: Payer: Self-pay

## 2019-01-02 VITALS — BP 109/70 | HR 71 | Wt 212.0 lb

## 2019-01-02 DIAGNOSIS — O0990 Supervision of high risk pregnancy, unspecified, unspecified trimester: Secondary | ICD-10-CM

## 2019-01-02 DIAGNOSIS — Z23 Encounter for immunization: Secondary | ICD-10-CM | POA: Diagnosis not present

## 2019-01-02 DIAGNOSIS — Z3009 Encounter for other general counseling and advice on contraception: Secondary | ICD-10-CM

## 2019-01-02 DIAGNOSIS — Z3A28 28 weeks gestation of pregnancy: Secondary | ICD-10-CM

## 2019-01-02 DIAGNOSIS — O0993 Supervision of high risk pregnancy, unspecified, third trimester: Secondary | ICD-10-CM

## 2019-01-02 DIAGNOSIS — Z98891 History of uterine scar from previous surgery: Secondary | ICD-10-CM

## 2019-01-02 NOTE — Progress Notes (Signed)
   PRENATAL VISIT NOTE  Subjective:  Hannah Hamilton is a 38 y.o. G3P2002 at [redacted]w[redacted]d being seen today for ongoing prenatal care.  She is currently monitored for the following issues for this high-risk pregnancy and has Supervision of high risk pregnancy, antepartum; Hx of preeclampsia, prior pregnancy, currently pregnant; Advanced maternal age in multigravida; Previous cesarean delivery, antepartum; and Carpal tunnel syndrome during pregnancy on their problem list.  Patient reports no complaints.  Contractions: Not present. Vag. Bleeding: None.  Movement: Present. Denies leaking of fluid.   The following portions of the patient's history were reviewed and updated as appropriate: allergies, current medications, past family history, past medical history, past social history, past surgical history and problem list. Problem list updated.  Objective:   Vitals:   01/02/19 0826  BP: 109/70  Pulse: 71  Weight: 96.2 kg    Fetal Status: Fetal Heart Rate (bpm): 154   Movement: Present     General:  Alert, oriented and cooperative. Patient is in no acute distress.  Skin: Skin is warm and dry. No rash noted.   Cardiovascular: Normal heart rate noted  Respiratory: Normal respiratory effort, no problems with respiration noted  Abdomen: Soft, gravid, appropriate for gestational age.  Pain/Pressure: Present     Pelvic: Cervical exam deferred        Extremities: Normal range of motion.  Edema: None  Mental Status: Normal mood and affect. Normal behavior. Normal judgment and thought content.   Assessment and Plan:  Pregnancy: G3P2002 at [redacted]w[redacted]d  1. Supervision of high risk pregnancy, antepartum - Continue routine care - Discussed maternity belt, compressions socks to assist with muscle discomfort at work - Glucose Tolerance, 2 Hours w/1 Hour - CBC - RPR - HIV Antibody (routine testing w rflx)  2. Unwanted fertility - BTL consent signed today  3. Hx of cesarean section - Desires repeat cesarean  section - Message sent to scheduler, pt desires October 19 (39+0)  Preterm labor symptoms and general obstetric precautions including but not limited to vaginal bleeding, contractions, leaking of fluid and fetal movement were reviewed in detail with the patient. Please refer to After Visit Summary for other counseling recommendations.  Return in about 4 weeks (around 01/30/2019) for Virtual appointment.  Future Appointments  Date Time Provider [place]  01/30/2019  9:00 AM Darlina Rumpf, [place] [facility]  02/06/2019  8:30 AM [place] [facility]  02/06/2019  8:30 AM [place] 1 [facility]-M[facility] MFC-US    Mallie Snooks, MSN, CNM Certified Nurse Midwife, [employer] for [employer] [facility], [place] 01/02/19 11:09 AM

## 2019-01-02 NOTE — Patient Instructions (Signed)
Cesarean Delivery Cesarean birth, or cesarean delivery, is the surgical delivery of a baby through an incision in the abdomen and the uterus. This may be referred to as a C-section. This procedure may be scheduled ahead of time, or it may be done in an emergency situation. Tell a health care provider about:  Any allergies you have.  All medicines you are taking, including vitamins, herbs, eye drops, creams, and over-the-counter medicines.  Any problems you or family members have had with anesthetic medicines.  Any blood disorders you have.  Any surgeries you have had.  Any medical conditions you have.  Whether you or any members of your family have a history of deep vein thrombosis (DVT) or pulmonary embolism (PE). What are the risks? Generally, this is a safe procedure. However, problems may occur, including:  Infection.  Bleeding.  Allergic reactions to medicines.  Damage to other structures or organs.  Blood clots.  Injury to your baby. What happens before the procedure? General instructions  Follow instructions from your health care provider about eating or drinking restrictions.  If you know that you are going to have a cesarean delivery, do not shave your pubic area. Shaving before the procedure may increase your risk of infection.  Plan to have someone take you home from the hospital.  Ask your health care provider what steps will be taken to prevent infection. These may include: ? Removing hair at the surgery site. ? Washing skin with a germ-killing soap. ? Taking antibiotic medicine.  Depending on the reason for your cesarean delivery, you may have a physical exam or additional testing, such as an ultrasound.  You may have your blood or urine tested. Questions for your health care provider  Ask your health care provider about: ? Changing or stopping your regular medicines. This is especially important if you are taking diabetes medicines or blood thinners.  ? Your pain management plan. This is especially important if you plan to breastfeed your baby. ? How long you will be in the hospital after the procedure. ? Any concerns you may have about receiving blood products, if you need them during the procedure. ? Cord blood banking, if you plan to collect your baby's umbilical cord blood.  You may also want to ask your health care provider: ? Whether you will be able to hold or breastfeed your baby while you are still in the operating room. ? Whether your baby can stay with you immediately after the procedure and during your recovery. ? Whether a family member or a person of your choice can go with you into the operating room and stay with you during the procedure, immediately after the procedure, and during your recovery. What happens during the procedure?   An IV will be inserted into one of your veins.  Fluid and medicines, such as antibiotics, will be given before the surgery.  Fetal monitors will be placed on your abdomen to check your baby's heart rate.  You may be given a special warming gown to wear to keep your temperature stable.  A catheter may be inserted into your bladder through your urethra. This drains your urine during the procedure.  You may be given one or more of the following: ? A medicine to numb the area (local anesthetic). ? A medicine to make you fall asleep (general anesthetic). ? A medicine (regional anesthetic) that is injected into your back or through a small thin tube placed in your back (spinal anesthetic or epidural anesthetic).   This numbs everything below the injection site and allows you to stay awake during your procedure. If this makes you feel nauseous, tell your health care provider. Medicines will be available to help reduce any nausea you may feel.  An incision will be made in your abdomen, and then in your uterus.  If you are awake during your procedure, you may feel tugging and pulling in your abdomen,  but you should not feel pain. If you feel pain, tell your health care provider immediately.  Your baby will be removed from your uterus. You may feel more pressure or pushing while this happens.  Immediately after birth, your baby will be dried and kept warm. You may be able to hold and breastfeed your baby.  The umbilical cord may be clamped and cut during this time. This usually occurs after waiting a period of 1-2 minutes after delivery.  Your placenta will be removed from your uterus.  Your incisions will be closed with stitches (sutures). Staples, skin glue, or adhesive strips may also be applied to the incision in your abdomen.  Bandages (dressings) may be placed over the incision in your abdomen. The procedure may vary among health care providers and hospitals. What happens after the procedure?  Your blood pressure, heart rate, breathing rate, and blood oxygen level will be monitored until you are discharged from the hospital.  You may continue to receive fluids and medicines through an IV.  You will have some pain. Medicines will be available to help control your pain.  To help prevent blood clots: ? You may be given medicines. ? You may have to wear compression stockings or devices. ? You will be encouraged to walk around when you are able.  Hospital staff will encourage and support bonding with your baby. Your hospital may have you and your baby to stay in the same room (rooming in) during your hospital stay to encourage successful bonding and breastfeeding.  You may be encouraged to cough and breathe deeply often. This helps to prevent lung problems.  If you have a catheter draining your urine, it will be removed as soon as possible after your procedure. Summary  Cesarean birth, or cesarean delivery, is the surgical delivery of a baby through an incision in the abdomen and the uterus.  Follow instructions from your health care provider about eating or drinking  restrictions before the procedure.  You will have some pain after the procedure. Medicines will be available to help control your pain.  Hospital staff will encourage and support bonding with your baby after the procedure. Your hospital may have you and your baby to stay in the same room (rooming in) during your hospital stay to encourage successful bonding and breastfeeding. This information is not intended to replace advice given to you by your health care provider. Make sure you discuss any questions you have with your health care provider. Document Released: 05/16/2005 Document Revised: 11/20/2017 Document Reviewed: 11/20/2017 Elsevier Patient Education  2020 Elsevier Inc.  

## 2019-01-03 LAB — HIV ANTIBODY (ROUTINE TESTING W REFLEX): HIV Screen 4th Generation wRfx: NONREACTIVE

## 2019-01-03 LAB — CBC
Hematocrit: 33.6 % — ABNORMAL LOW (ref 34.0–46.6)
Hemoglobin: 11.9 g/dL (ref 11.1–15.9)
MCH: 32 pg (ref 26.6–33.0)
MCHC: 35.4 g/dL (ref 31.5–35.7)
MCV: 90 fL (ref 79–97)
Platelets: 224 10*3/uL (ref 150–450)
RBC: 3.72 x10E6/uL — ABNORMAL LOW (ref 3.77–5.28)
RDW: 12.2 % (ref 11.7–15.4)
WBC: 14.1 10*3/uL — ABNORMAL HIGH (ref 3.4–10.8)

## 2019-01-03 LAB — RPR: RPR Ser Ql: NONREACTIVE

## 2019-01-03 LAB — GLUCOSE TOLERANCE, 2 HOURS W/ 1HR
Glucose, 1 hour: 136 mg/dL (ref 65–179)
Glucose, 2 hour: 112 mg/dL (ref 65–152)
Glucose, Fasting: 77 mg/dL (ref 65–91)

## 2019-01-27 ENCOUNTER — Emergency Department (HOSPITAL_COMMUNITY): Payer: Medicaid Other

## 2019-01-27 ENCOUNTER — Encounter (HOSPITAL_COMMUNITY): Payer: Self-pay | Admitting: *Deleted

## 2019-01-27 ENCOUNTER — Other Ambulatory Visit: Payer: Self-pay

## 2019-01-27 ENCOUNTER — Emergency Department (HOSPITAL_COMMUNITY)
Admission: EM | Admit: 2019-01-27 | Discharge: 2019-01-27 | Disposition: A | Discharge status: Home / Self Care | Payer: Medicaid Other | Attending: Emergency Medicine | Admitting: Emergency Medicine

## 2019-01-27 DIAGNOSIS — Z3A36 36 weeks gestation of pregnancy: Secondary | ICD-10-CM | POA: Insufficient documentation

## 2019-01-27 DIAGNOSIS — J181 Lobar pneumonia, unspecified organism: Secondary | ICD-10-CM | POA: Insufficient documentation

## 2019-01-27 DIAGNOSIS — Z20828 Contact with and (suspected) exposure to other viral communicable diseases: Secondary | ICD-10-CM | POA: Insufficient documentation

## 2019-01-27 DIAGNOSIS — R05 Cough: Secondary | ICD-10-CM | POA: Diagnosis not present

## 2019-01-27 DIAGNOSIS — F17210 Nicotine dependence, cigarettes, uncomplicated: Secondary | ICD-10-CM | POA: Insufficient documentation

## 2019-01-27 DIAGNOSIS — O9989 Other specified diseases and conditions complicating pregnancy, childbirth and the puerperium: Secondary | ICD-10-CM | POA: Diagnosis not present

## 2019-01-27 DIAGNOSIS — O99333 Smoking (tobacco) complicating pregnancy, third trimester: Secondary | ICD-10-CM | POA: Insufficient documentation

## 2019-01-27 DIAGNOSIS — J189 Pneumonia, unspecified organism: Secondary | ICD-10-CM | POA: Diagnosis not present

## 2019-01-27 DIAGNOSIS — Z79899 Other long term (current) drug therapy: Secondary | ICD-10-CM | POA: Insufficient documentation

## 2019-01-27 DIAGNOSIS — Z7982 Long term (current) use of aspirin: Secondary | ICD-10-CM | POA: Insufficient documentation

## 2019-01-27 DIAGNOSIS — O99513 Diseases of the respiratory system complicating pregnancy, third trimester: Secondary | ICD-10-CM | POA: Diagnosis not present

## 2019-01-27 DIAGNOSIS — Z3A32 32 weeks gestation of pregnancy: Secondary | ICD-10-CM | POA: Diagnosis not present

## 2019-01-27 LAB — SARS CORONAVIRUS 2 BY RT PCR (HOSPITAL ORDER, PERFORMED IN ~~LOC~~ HOSPITAL LAB): SARS Coronavirus 2: NEGATIVE

## 2019-01-27 MED ORDER — AZITHROMYCIN 250 MG PO TABS
500.0000 mg | ORAL_TABLET | Freq: Once | ORAL | Status: AC
  Administered 2019-01-27: 500 mg via ORAL
  Filled 2019-01-27: qty 2

## 2019-01-27 MED ORDER — AZITHROMYCIN 250 MG PO TABS: 250.0000 mg | ORAL_TABLET | Freq: Every day | ORAL | 0 refills | Status: DC

## 2019-01-27 MED ORDER — ALBUTEROL SULFATE HFA 108 (90 BASE) MCG/ACT IN AERS
2.0000 | INHALATION_SPRAY | Freq: Once | RESPIRATORY_TRACT | Status: AC
  Administered 2019-01-27: 17:00:00 2 via RESPIRATORY_TRACT
  Filled 2019-01-27: qty 6.7

## 2019-01-27 NOTE — ED Triage Notes (Signed)
Pt reporting for 1.5 weeks she has cough and wheezing. Yellow/greenish mucous. No fevers. Has been using mucinex without relief. Denies fevers. She is pregnant, due 03/18/19.

## 2019-01-27 NOTE — ED Notes (Signed)
Pt up to the br 

## 2019-01-27 NOTE — Discharge Instructions (Addendum)
You can try the zarbees cough syrup or honey as a natural cough suppressant.  This is safe to take while pregnant.  Starting tomorrow, take the antibiotic once daily until gone. Follow-up closely with your OB specialist regarding her diagnosis today. It is more you stay hydrated. Return to the emergency department if you have difficulty breathing, no improvement in symptoms after antibiotics, or new concerning symptoms.

## 2019-01-27 NOTE — ED Provider Notes (Signed)
[facility] EMERGENCY DEPARTMENT Provider Note   CSN: [id number] Arrival date & time: 01/27/19  1603     History   Chief Complaint Chief Complaint  Patient presents with  . Cough    HPI Hannah Hamilton is a 38 y.o. female G3P2 currently [redacted] weeks gestation, presenting to the emergency department with complaint of 1.5 weeks of cough and nasal congestion.  She states it initially started over night with head congestion.she has developed a cough that is intermittently productive of greenish phlegm, sometimes dry. Sometimes she has post tussive emesis.she states she had a sore throat the first 2 days then it subsided.  Her OB instructed she take Mucinex for her symptoms, however has not provided significant relief. Denies associated fevers, body aches, changes in taste or smell. No complications with pregnancy, OB is [facility].  She is a daily cigarette smoker 2-3 per day.  She reports she works in [employer], however no coworkers with known illness.  No known contacts with COVID virus.  Her youngest daughter coughing as well.     The history is provided by the patient.    Past Medical History:  Diagnosis Date  . Pregnancy induced hypertension     Patient Active Problem List   Diagnosis Date Noted  . Carpal tunnel syndrome during pregnancy 10/24/2018  . Supervision of high risk pregnancy, antepartum 09/12/2018  . Hx of preeclampsia, prior pregnancy, currently pregnant 09/12/2018  . Advanced maternal age in multigravida 09/12/2018  . Previous cesarean delivery, antepartum 09/12/2018    Past Surgical History:  Procedure Laterality Date  . CESAREAN SECTION    . TONSILLECTOMY       OB History    Gravida  3   Para  2   Term  2   Preterm      AB      Living  2     SAB      TAB      Ectopic      Multiple      Live Births  2            Home Medications    Prior to Admission medications   Medication Sig Start Date End Date Taking?  Authorizing Provider  albuterol (PROVENTIL HFA;VENTOLIN HFA) 108 (90 Base) MCG/ACT inhaler Inhale 1-2 puffs into the lungs every 6 (six) hours as needed for wheezing or shortness of breath. Patient not taking: Reported on 01/05/2018 08/02/17   Dowless, Dondra Spry, PA-C  aspirin EC 81 MG tablet Take 1 tablet (81 mg total) by mouth daily. Take after 12 weeks for prevention of preeclampsia later in pregnancy 09/12/18   Caren Macadam, MD  azithromycin (ZITHROMAX) 250 MG tablet Take 1 tablet (250 mg total) by mouth daily. Starting 01/28/2019, take 1 tablet daily until gone. 01/28/19   Robinson, [place] N, PA-C  Prenatal Vit-Fe Fumarate-FA (PREPLUS) 27-1 MG TABS Take 1 tablet by mouth daily. 09/13/18   Caren Macadam, MD  prenatal vitamin w/FE, FA (PRENATAL 1 + 1) 27-1 MG TABS tablet Take 1 tablet by mouth daily at 12 noon.    [provider]    Family History No family history on file.  Social History Social History   Tobacco Use  . Smoking status: Current Every Day Smoker    Packs/day: 0.50    Types: Cigarettes  . Smokeless tobacco: Never Used  Substance Use Topics  . Alcohol use: Yes    Comment: occasionally  . Drug  use: No     Allergies   Patient has no known allergies.   Review of Systems Review of Systems  Constitutional: Negative for fever.  HENT: Positive for congestion and sore throat. Negative for ear pain, trouble swallowing and voice change.   Respiratory: Positive for cough and wheezing. Negative for shortness of breath.   All other systems reviewed and are negative.    Physical Exam Updated Vital Signs BP 110/62 (BP Location: Right Arm)   Pulse 68   Temp 98.3 F (36.8 C) (Oral)   Resp 18   LMP 06/18/2018 (Approximate)   SpO2 98%   Physical Exam Vitals signs and nursing note reviewed.  Constitutional:      General: She is not in acute distress.    Appearance: She is well-developed. She is not ill-appearing.  HENT:     Head:  Normocephalic and atraumatic.     Right Ear: There is impacted cerumen.     Left Ear: There is impacted cerumen.     Mouth/Throat:     Mouth: Mucous membranes are moist.     Pharynx: Oropharynx is clear.  Eyes:     Conjunctiva/sclera: Conjunctivae normal.  Neck:     Musculoskeletal: Normal range of motion and neck supple.  Cardiovascular:     Rate and Rhythm: Normal rate and regular rhythm.  Pulmonary:     Effort: Pulmonary effort is normal. No respiratory distress.     Breath sounds: Wheezing present.     Comments: Speaking in full sentences, normal work of breathing.  O2 saturation 98% on room air. Abdominal:     Palpations: Abdomen is soft.     Comments: Gravid abdomen  Skin:    General: Skin is warm.  Neurological:     Mental Status: She is alert.  Psychiatric:        Behavior: Behavior normal.      ED Treatments / Results  Labs (all labs ordered are listed, but only abnormal results are displayed) Labs Reviewed  SARS CORONAVIRUS 2 (HOSPITAL ORDER, [facility]FOR[facility])    EKG None  Radiology Dg Chest Port 1 View  Result Date: 01/27/2019 CLINICAL DATA:  Productive cough EXAM: PORTABLE CHEST 1 VIEW COMPARISON:  01/04/2018 and prior radiographs FINDINGS: Equivocal airspace disease within the LEFT LOWER lung noted. The cardiomediastinal silhouette is unremarkable. There is no evidence of pulmonary edema, suspicious pulmonary nodule/mass, pleural effusion, or pneumothorax. No acute bony abnormalities are identified. IMPRESSION: Equivocal airspace disease/pneumonia within the LEFT LOWER lung. Electronically Signed   By: Harmon PierJeffrey  Hu M.D.   On: 01/27/2019 17:44    Procedures Procedures (including critical care time)  Medications Ordered in ED Medications  albuterol (VENTOLIN HFA) 108 (90 Base) MCG/ACT inhaler 2 puff (2 puffs Inhalation Given 01/27/19 1715)  azithromycin (ZITHROMAX) tablet 500 mg (500 mg Oral Given 01/27/19 1935)     Initial  Impression / Assessment and Plan / ED Course  I have reviewed the triage vital signs and the nursing notes.  Pertinent labs & imaging results that were available during my care of the patient were reviewed by me and considered in my medical decision making (see chart for details).        Patient is currently [redacted] weeks pregnant, presenting with 1.5 weeks of intermittently productive cough and nasal congestion.  She has no known contacts with COVID positive people.  Her daughter is having similar symptoms though is also afebrile and doing well.  Patient denies shortness of breath  or fever.  No abdominal complaints, changes in taste or smell.  On exam she does have bilateral wheezes present to though is speaking in full sentences with O2 saturation 98% on room air.  She is in no distress, well-appearing.  Last x-ray revealing a left lower lobe pneumonia.  COVID test is negative in the ED.  Vital signs are stable she is afebrile.  Will treat with Z-Pak for suspected community-acquired pneumonia.  She is instructed to follow-up closely with her OB specialist as she does not have a PCP.  Instructed to return to the emergency department if her symptoms do not improve or worsen.  She is agreeable to plan and safe for discharge.  Discussed results, findings, treatment and follow up. Patient advised of return precautions. Patient verbalized understanding and agreed with plan.   Final Clinical Impressions(s) / ED Diagnoses   Final diagnoses:  Community acquired pneumonia of left lower lobe of lung [facility])    ED Discharge Orders         Ordered    azithromycin (ZITHROMAX) 250 MG tablet  Daily     01/27/19 1933           Robinson, [place] N, PA-C 01/27/19 Anders Simmonds, MD 01/28/19 [phone]

## 2019-01-27 NOTE — ED Notes (Signed)
Pr took 2 puffs from her inhaler  Chest xray has been done

## 2019-01-27 NOTE — ED Notes (Signed)
Cold cough for 1-2 weeks no temp chest and head congestion   Non-productive cough except intermittent   She is a smoker of cig  lmp  preg 32 weeks

## 2019-01-30 ENCOUNTER — Telehealth (INDEPENDENT_AMBULATORY_CARE_PROVIDER_SITE_OTHER): Payer: Medicaid Other | Admitting: Advanced Practice Midwife

## 2019-01-30 ENCOUNTER — Other Ambulatory Visit: Payer: Self-pay

## 2019-01-30 DIAGNOSIS — O09523 Supervision of elderly multigravida, third trimester: Secondary | ICD-10-CM

## 2019-01-30 DIAGNOSIS — O34219 Maternal care for unspecified type scar from previous cesarean delivery: Secondary | ICD-10-CM

## 2019-01-30 DIAGNOSIS — O09299 Supervision of pregnancy with other poor reproductive or obstetric history, unspecified trimester: Secondary | ICD-10-CM

## 2019-01-30 DIAGNOSIS — Z3A32 32 weeks gestation of pregnancy: Secondary | ICD-10-CM

## 2019-01-30 NOTE — Progress Notes (Signed)
TELEHEALTH OBSTETRICS PRENATAL VIRTUAL VIDEO VISIT ENCOUNTER NOTE  [facility]   I connected with Hartford FinancialFaith Hamilton on 01/30/19 at  9:00 AM EDT by MyChart Video Encounter at home and verified that I am speaking with the correct person using two identifiers.   I discussed the limitations, risks, security and privacy concerns of performing an evaluation and management service virtually and the availability of in person appointments. I also discussed with the patient that there may be a patient responsible charge related to this service. The patient expressed understanding and agreed to proceed. Subjective:  Hannah ForehandFaith Hamilton is a 38 y.o. G3P2002 at 213w2d being seen today for ongoing prenatal care.  She is currently monitored for the following issues for this high-risk pregnancy and has Supervision of high risk pregnancy, antepartum; Hx of preeclampsia, prior pregnancy, currently pregnant; Advanced maternal age in multigravida; Previous cesarean delivery, antepartum; and Carpal tunnel syndrome during pregnancy on their problem list.  Patient reports no complaints.  Contractions: Irregular. Vag. Bleeding: None.  Movement: Present. Denies any leaking of fluid.   The following portions of the patient's history were reviewed and updated as appropriate: allergies, current medications, past family history, past medical history, past social history, past surgical history and problem list.   Objective:   Vitals:   01/30/19 0906  BP: 116/72    Fetal Status:     Movement: Present     General:  Alert, oriented and cooperative. Patient is in no acute distress.  Respiratory: Normal respiratory effort, no problems with respiration noted  Mental Status: Normal mood and affect. Normal behavior. Normal judgment and thought content.  Rest of physical exam deferred due to type of encounter  Imaging: Dg Chest Port 1 View  Result Date: 01/27/2019 CLINICAL DATA:   Productive cough EXAM: PORTABLE CHEST 1 VIEW COMPARISON:  01/04/2018 and prior radiographs FINDINGS: Equivocal airspace disease within the LEFT LOWER lung noted. The cardiomediastinal silhouette is unremarkable. There is no evidence of pulmonary edema, suspicious pulmonary nodule/mass, pleural effusion, or pneumothorax. No acute bony abnormalities are identified. IMPRESSION: Equivocal airspace disease/pneumonia within the LEFT LOWER lung. Electronically Signed   By: Harmon PierJeffrey  Hu M.D.   On: 01/27/2019 17:44    Assessment and Plan:  Pregnancy: G3P2002 at 5113w2d 1. Multigravida of advanced maternal age in third trimester - Continue routine care - S/p diagnosis and treatment of pneumonia. Stable. Encouraged to finish abx  2. Hx of preeclampsia, prior pregnancy, currently pregnant - Normotensive   3. Previous cesarean delivery, antepartum - repeat c/s and BTL already scheduled  Preterm labor symptoms and general obstetric precautions including but not limited to vaginal bleeding, contractions, leaking of fluid and fetal movement were reviewed in detail with the patient. I discussed the assessment and treatment plan with the patient. The patient was provided an opportunity to ask questions and all were answered. The patient agreed with the plan and demonstrated an understanding of the instructions. The patient was advised to call back or seek an in-person office evaluation/[facility] for any urgent or concerning symptoms. Please refer to After Visit Summary for other counseling recommendations.   I provided 12 minutes of face-to-face time during this encounter.  Return in about 4 weeks (around 02/27/2019) for 36 week swabs.  Future Appointments  Date Time Provider Department Center  02/06/2019  8:30 AM [facility] NURSE [facility]  02/06/2019  8:30 AM [facility] US 1 [facility]US [facility]  02/28/2019  9:00  AM Aletha Halim, MD [facility]    Darlina Rumpf, [place] for [employer], [place]

## 2019-01-30 NOTE — Patient Instructions (Signed)

## 2019-01-30 NOTE — Progress Notes (Signed)
I connected with  Takela Callari on 01/30/19 at  9:00 AM EDT by telephone and verified that I am speaking with the correct person using two identifiers.   I discussed the limitations, risks, security and privacy concerns of performing an evaluation and management service by telephone and the availability of in person appointments. I also discussed with the patient that there may be a patient responsible charge related to this service. The patient expressed understanding and agreed to proceed.  Crosby Oyster, RN 01/30/2019  9:07 AM

## 2019-02-06 ENCOUNTER — Encounter (HOSPITAL_COMMUNITY): Payer: Self-pay

## 2019-02-06 ENCOUNTER — Ambulatory Visit (HOSPITAL_COMMUNITY): Payer: Medicaid Other | Admitting: *Deleted

## 2019-02-06 ENCOUNTER — Ambulatory Visit (HOSPITAL_COMMUNITY)
Admission: RE | Admit: 2019-02-06 | Discharge: 2019-02-06 | Disposition: A | Discharge status: Home / Self Care | Payer: Medicaid Other | Source: Ambulatory Visit | Attending: Maternal & Fetal Medicine | Admitting: Maternal & Fetal Medicine

## 2019-02-06 ENCOUNTER — Other Ambulatory Visit: Payer: Self-pay

## 2019-02-06 DIAGNOSIS — O34219 Maternal care for unspecified type scar from previous cesarean delivery: Secondary | ICD-10-CM | POA: Diagnosis not present

## 2019-02-06 DIAGNOSIS — Z362 Encounter for other antenatal screening follow-up: Secondary | ICD-10-CM

## 2019-02-06 DIAGNOSIS — G5600 Carpal tunnel syndrome, unspecified upper limb: Secondary | ICD-10-CM

## 2019-02-06 DIAGNOSIS — O09293 Supervision of pregnancy with other poor reproductive or obstetric history, third trimester: Secondary | ICD-10-CM | POA: Diagnosis not present

## 2019-02-06 DIAGNOSIS — O99213 Obesity complicating pregnancy, third trimester: Secondary | ICD-10-CM | POA: Diagnosis not present

## 2019-02-06 DIAGNOSIS — O09299 Supervision of pregnancy with other poor reproductive or obstetric history, unspecified trimester: Secondary | ICD-10-CM

## 2019-02-06 DIAGNOSIS — Z3A33 33 weeks gestation of pregnancy: Secondary | ICD-10-CM | POA: Diagnosis not present

## 2019-02-06 DIAGNOSIS — O09523 Supervision of elderly multigravida, third trimester: Secondary | ICD-10-CM | POA: Diagnosis not present

## 2019-02-06 DIAGNOSIS — O26899 Other specified pregnancy related conditions, unspecified trimester: Secondary | ICD-10-CM | POA: Insufficient documentation

## 2019-02-06 DIAGNOSIS — O3413 Maternal care for benign tumor of corpus uteri, third trimester: Secondary | ICD-10-CM | POA: Diagnosis not present

## 2019-02-28 ENCOUNTER — Other Ambulatory Visit: Payer: Self-pay

## 2019-02-28 ENCOUNTER — Other Ambulatory Visit (HOSPITAL_COMMUNITY)
Admission: RE | Admit: 2019-02-28 | Discharge: 2019-02-28 | Disposition: A | Discharge status: Home / Self Care | Payer: Medicaid Other | Source: Ambulatory Visit | Attending: Obstetrics and Gynecology | Admitting: Obstetrics and Gynecology

## 2019-02-28 ENCOUNTER — Ambulatory Visit (INDEPENDENT_AMBULATORY_CARE_PROVIDER_SITE_OTHER): Payer: Medicaid Other | Admitting: Obstetrics and Gynecology

## 2019-02-28 VITALS — BP 110/72 | HR 72 | Wt 211.2 lb

## 2019-02-28 DIAGNOSIS — O0990 Supervision of high risk pregnancy, unspecified, unspecified trimester: Secondary | ICD-10-CM

## 2019-02-28 DIAGNOSIS — O0993 Supervision of high risk pregnancy, unspecified, third trimester: Secondary | ICD-10-CM

## 2019-02-28 DIAGNOSIS — Z3A36 36 weeks gestation of pregnancy: Secondary | ICD-10-CM

## 2019-02-28 NOTE — Progress Notes (Signed)
Prenatal Visit Note Date: 02/28/2019 [facility]  Subjective:  Hannah Hamilton is a 38 y.o. G3P2002 at [redacted]w[redacted]d being seen today for ongoing prenatal care.  She is currently monitored for the following issues for this high-risk pregnancy and has Supervision of high risk pregnancy, antepartum; Hx of preeclampsia, prior pregnancy, currently pregnant; Advanced maternal age in multigravida; Previous cesarean delivery, antepartum; and Carpal tunnel syndrome during pregnancy on their problem list.  Patient reports no complaints.   Contractions: Not present. Vag. Bleeding: None.  Movement: Present. Denies leaking of fluid.   The following portions of the patient's history were reviewed and updated as appropriate: allergies, current medications, past family history, past medical history, past social history, past surgical history and problem list. Problem list updated.  Objective:   Vitals:   02/28/19 0909  BP: 110/72  Pulse: 72  Weight: 211 lb 3.2 oz (95.8 kg)    Fetal Status: Fetal Heart Rate (bpm): 160 Fundal Height: 36 cm Movement: Present  Presentation: Vertex  General:  Alert, oriented and cooperative. Patient is in no acute distress.  Skin: Skin is warm and dry. No rash noted.   Cardiovascular: Normal heart rate noted  Respiratory: Normal respiratory effort, no problems with respiration noted  Abdomen: Soft, gravid, appropriate for gestational age. Pain/Pressure: Absent     Pelvic:  Cervical exam performed Dilation: Closed Effacement (%): 0 Station: Ballotable  Extremities: Normal range of motion.  Edema: Trace  Mental Status: Normal mood and affect. Normal behavior. Normal judgment and thought content.   Urinalysis:      Assessment and Plan:  Pregnancy: G3P2002 at [redacted]w[redacted]d  1. Supervision of high risk pregnancy, antepartum Routine care BTL papers UTD Rpt c/s already scheduled for 10/19 - Culture, beta strep (group b only) - GC/Chlamydia probe amp ([facility])not at [facility] - Flu Vaccine QUAD with presevative  Preterm labor symptoms and general obstetric precautions including but not limited to vaginal bleeding, contractions, leaking of fluid and fetal movement were reviewed in detail with the patient. Please refer to After Visit Summary for other counseling recommendations.  Return in about 1 week (around 03/07/2019).   Aletha Halim, MD

## 2019-03-01 LAB — GC/CHLAMYDIA PROBE AMP (~~LOC~~) NOT AT ARMC
Chlamydia: NEGATIVE
Neisseria Gonorrhea: NEGATIVE

## 2019-03-03 LAB — CULTURE, BETA STREP (GROUP B ONLY): Strep Gp B Culture: NEGATIVE

## 2019-03-04 ENCOUNTER — Encounter (HOSPITAL_COMMUNITY): Payer: Self-pay

## 2019-03-04 NOTE — Patient Instructions (Signed)
Kinda Pidgeon  03/04/2019   Your procedure is scheduled on:  03/18/2019  Arrive at 64 at Entrance C on [address] [facility]  and [address] [employer]. You are invited to use the FREE valet parking or use the Visitor's parking deck.  Pick up the phone at the desk and dial [phone].  Call this number if you have problems the morning of surgery: [phone]  Remember:   Do not eat food:(After Midnight) Desps de medianoche.  Do not drink clear liquids: (After Midnight) Desps de medianoche.  Take these medicines the morning of surgery with A SIP OF WATER:  none   Do not wear jewelry, make-up or nail polish.  Do not wear lotions, powders, or perfumes. Do not wear deodorant.  Do not shave 48 hours prior to surgery.  Do not bring valuables to the hospital.  [facility] is not   responsible for any belongings or valuables brought to the hospital.  Contacts, dentures or bridgework may not be worn into surgery.  Leave suitcase in the car. After surgery it may be brought to your room.  For patients admitted to the hospital, checkout time is 11:00 AM the day of              discharge.      Please read over the following fact sheets that you were given:     Preparing for Surgery

## 2019-03-05 ENCOUNTER — Telehealth (INDEPENDENT_AMBULATORY_CARE_PROVIDER_SITE_OTHER): Payer: Medicaid Other | Admitting: Family Medicine

## 2019-03-05 ENCOUNTER — Encounter: Payer: Self-pay | Admitting: Family Medicine

## 2019-03-05 ENCOUNTER — Telehealth: Payer: Self-pay | Admitting: Radiology

## 2019-03-05 ENCOUNTER — Other Ambulatory Visit: Payer: Self-pay

## 2019-03-05 VITALS — BP 120/74 | HR 83

## 2019-03-05 DIAGNOSIS — O0990 Supervision of high risk pregnancy, unspecified, unspecified trimester: Secondary | ICD-10-CM

## 2019-03-05 DIAGNOSIS — O09523 Supervision of elderly multigravida, third trimester: Secondary | ICD-10-CM

## 2019-03-05 DIAGNOSIS — O26899 Other specified pregnancy related conditions, unspecified trimester: Secondary | ICD-10-CM

## 2019-03-05 DIAGNOSIS — O09299 Supervision of pregnancy with other poor reproductive or obstetric history, unspecified trimester: Secondary | ICD-10-CM

## 2019-03-05 DIAGNOSIS — G5600 Carpal tunnel syndrome, unspecified upper limb: Secondary | ICD-10-CM

## 2019-03-05 DIAGNOSIS — O09293 Supervision of pregnancy with other poor reproductive or obstetric history, third trimester: Secondary | ICD-10-CM

## 2019-03-05 DIAGNOSIS — O26893 Other specified pregnancy related conditions, third trimester: Secondary | ICD-10-CM

## 2019-03-05 DIAGNOSIS — O34219 Maternal care for unspecified type scar from previous cesarean delivery: Secondary | ICD-10-CM

## 2019-03-05 DIAGNOSIS — Z3A37 37 weeks gestation of pregnancy: Secondary | ICD-10-CM

## 2019-03-05 DIAGNOSIS — O0993 Supervision of high risk pregnancy, unspecified, third trimester: Secondary | ICD-10-CM

## 2019-03-05 NOTE — Patient Instructions (Signed)

## 2019-03-05 NOTE — Progress Notes (Signed)
TELEHEALTH OBSTETRICS PRENATAL VIRTUAL VIDEO VISIT ENCOUNTER NOTE  Provider [facility]   I connected with Hannah Hamilton on 03/05/19 at  8:15 AM EDT by MyChart Video Encounter at home and verified that I am speaking with the correct person using two identifiers.   I discussed the limitations, risks, security and privacy concerns of performing an evaluation and management service virtually and the availability of in person appointments. I also discussed with the patient that there may be a patient responsible charge related to this service. The patient expressed understanding and agreed to proceed. Subjective:  Hannah Hamilton is a 38 y.o. G3P2002 at 476w1d being seen today for ongoing prenatal care.  She is currently monitored for the following issues for this low-risk pregnancy and has Supervision of high risk pregnancy, antepartum; Hx of preeclampsia, prior pregnancy, currently pregnant; Advanced maternal age in multigravida; Previous cesarean delivery, antepartum; and Carpal tunnel syndrome during pregnancy on their problem list.  Patient reports no complaints.  Contractions: Not present. Vag. Bleeding: None.  Movement: Present. Denies any leaking of fluid.   The following portions of the patient's history were reviewed and updated as appropriate: allergies, current medications, past family history, past medical history, past social history, past surgical history and problem list.   Objective:   Vitals:   03/05/19 0945  BP: 120/74  Pulse: 83    Fetal Status:     Movement: Present     General:  Alert, oriented and cooperative. Patient is in no acute distress.  Respiratory: Normal respiratory effort, no problems with respiration noted  Mental Status: Normal mood and affect. Normal behavior. Normal judgment and thought content.  Rest of physical exam deferred due to type of encounter  Imaging: [place] Mfm Ob Follow Up  Result Date: 02/06/2019  ----------------------------------------------------------------------  OBSTETRICS REPORT                       (Signed Final 02/06/2019 10:57 am) ---------------------------------------------------------------------- Patient Info  ID #:       [id number]                          D.O.B.:  November 18, 1980 (38 yrs)  Name:       Hannah Hamilton                     Visit Date: 02/06/2019 08:51 am ---------------------------------------------------------------------- Performed By  Performed By:     Eden Lathearrie Stalter BS      [facility]                    RDMS RVT  Attending:        Ma RingsVictor Fang MD         Address:          [address]                                                             [address]  Referred By:      Isa RankinKIMBERLY NILES         Location:         [facility]                                F[facility]  Ref. Address:     [address]                    [address] ---------------------------------------------------------------------- Orders   #  Description                          Code         Ordered By   1  [place] MFM OB FOLLOW UP                  16109.60     Lin Landsman  ----------------------------------------------------------------------   #  Order #                    Accession #                 Episode #   1  [id number]                  [id number]                  [id number]  ---------------------------------------------------------------------- Indications   Encounter for other antenatal screening        Z36.2   follow-up   Advanced maternal age multigravida 89+,        O29.523   third trimester   Previous cesarean delivery, antepartum         O34.219   NIPT Low Risk   Poor obstetric history: Previous pp eclampsia  O09.299   Uterine fibroids                               O34.10   [redacted] weeks gestation of pregnancy                Z3A.33  ----------------------------------------------------------------------  Vital Signs                                                 Height:        5'4" ---------------------------------------------------------------------- Fetal Evaluation  Num Of Fetuses:         1  Fetal Heart Rate(bpm):  165  Cardiac Activity:       Observed  Presentation:           Transverse, head to maternal left  Placenta:               Posterior  P. Cord Insertion:      Visualized  Amniotic Fluid  AFI FV:      Within normal limits  AFI Sum(cm)     %Tile       Largest Pocket(cm)  18.09           67  7.2  RUQ(cm)       RLQ(cm)       LUQ(cm)        LLQ(cm)  3.3           2.2           5.39           7.2 ---------------------------------------------------------------------- Biometry  BPD:      83.6  mm     G. Age:  33w 5d         56  %    CI:         72.5   %    70 - 86                                                          FL/HC:      18.8   %    19.9 - 21.5  HC:      312.3  mm     G. Age:  35w 0d         35  %    HC/AC:      1.01        0.96 - 1.11  AC:      310.7  mm     G. Age:  35w 0d         91  %    FL/BPD:     70.3   %    71 - 87  FL:       58.8  mm     G. Age:  30w 5d          1  %    FL/AC:      18.9   %    20 - 24  HUM:      53.2  mm     G. Age:  31w 0d         12  %  Est. FW:    2255  gm           5 lb     54  % ---------------------------------------------------------------------- OB History  Gravidity:    3         Term:   2  Living:       2 ---------------------------------------------------------------------- Gestational Age  LMP:           33w 2d        Date:  06/18/18                 EDD:   03/25/19  U/S Today:     33w 4d                                        EDD:   03/23/19  Best:          33w 2d     Det. By:  LMP  (06/18/18)          EDD:   03/25/19 ---------------------------------------------------------------------- Anatomy  Cranium:               Appears normal         Aortic Arch:  Previously seen  Cavum:                 Appears normal         Ductal Arch:             Previously seen  Ventricles:            Appears normal         Diaphragm:              Appears normal  Choroid Plexus:        Appears normal         Stomach:                Appears normal, left                                                                        sided  Cerebellum:            Appears normal         Abdomen:                Appears normal  Posterior Fossa:       Appears normal         Abdominal Wall:         Previously seen  Nuchal Fold:           Previously seen        Cord Vessels:           Appears normal (3                                                                        vessel cord)  Face:                  Appears normal         Kidneys:                Appear normal                         (orbits and profile)  Lips:                  Appears normal         Bladder:                Appears normal  Palate:                Previously seen        Spine:                  Previously seen  Thoracic:              Appears normal         Upper Extremities:      Previously seen  Heart:                 Appears normal  Lower Extremities:      Previously seen                         (4CH, axis, and si  LVOT:                  Appears normal  Other:  Nasal bone visualized. Open hands/5th digits and heels previously          visualized. Technically difficult due to fetal position. ---------------------------------------------------------------------- Cervix Uterus Adnexa  Cervix  Not visualized (advanced GA >24wks)  Uterus  No abnormality visualized.  Left Ovary  Not visualized.  Right Ovary  Not visualized.  Cul De Sac  No free fluid seen.  Adnexa  No abnormality visualized. ---------------------------------------------------------------------- Comments  This patient was seen for a follow up growth scan due to  advanced maternal age and history of uterine fibroids.  She  denies any problems since her last exam.  She was informed that the fetal growth and amniotic fluid  level appears appropriate for  her gestational age.  Follow-up as indicated. ----------------------------------------------------------------------                   Ma Rings, MD Electronically Signed Final Report   02/06/2019 10:57 am ----------------------------------------------------------------------   Assessment and Plan:  Pregnancy: [id number] at [redacted]w[redacted]d 1. Carpal tunnel syndrome during pregnancy Wrist splints prn  2. Hx of preeclampsia, prior pregnancy, currently pregnant Normal BP today  3. Multigravida of advanced maternal age in third trimester Normal NIPT  4. Previous cesarean delivery, antepartum X 2 for repeat at 39 wks.  5. Supervision of high risk pregnancy, antepartum   Term labor symptoms and general obstetric precautions including but not limited to vaginal bleeding, contractions, leaking of fluid and fetal movement were reviewed in detail with the patient. I discussed the assessment and treatment plan with the patient. The patient was provided an opportunity to ask questions and all were answered. The patient agreed with the plan and demonstrated an understanding of the instructions. The patient was advised to call back or seek an in-person office evaluation/go to [facility]U at [facility] for any urgent or concerning symptoms. Please refer to After Visit Summary for other counseling recommendations.   I provided 10 minutes of face-to-face time during this encounter.  Return in 1 week (on 03/12/2019) for virtual.  Future Appointments  Date Time Provider Department Center  03/12/2019  4:15 PM [place] Bing, MD [facility]  03/16/2019  8:10 AM [facility] 1 [facility] None    Reva Bores, MD Center [facility]

## 2019-03-05 NOTE — Progress Notes (Signed)
Patient did not keep appointment today. She will be called to reschedule.  

## 2019-03-05 NOTE — Telephone Encounter (Signed)
Called patient for virtual visit with Dr Kennon Rounds, goes straight to voicemail. Called twice

## 2019-03-12 ENCOUNTER — Other Ambulatory Visit: Payer: Self-pay

## 2019-03-12 ENCOUNTER — Telehealth (INDEPENDENT_AMBULATORY_CARE_PROVIDER_SITE_OTHER): Payer: Medicaid Other | Admitting: Obstetrics and Gynecology

## 2019-03-12 DIAGNOSIS — J988 Other specified respiratory disorders: Secondary | ICD-10-CM

## 2019-03-12 DIAGNOSIS — O09293 Supervision of pregnancy with other poor reproductive or obstetric history, third trimester: Secondary | ICD-10-CM | POA: Diagnosis not present

## 2019-03-12 DIAGNOSIS — O26893 Other specified pregnancy related conditions, third trimester: Secondary | ICD-10-CM | POA: Diagnosis not present

## 2019-03-12 DIAGNOSIS — O34219 Maternal care for unspecified type scar from previous cesarean delivery: Secondary | ICD-10-CM | POA: Diagnosis not present

## 2019-03-12 DIAGNOSIS — O0990 Supervision of high risk pregnancy, unspecified, unspecified trimester: Secondary | ICD-10-CM

## 2019-03-12 DIAGNOSIS — O09523 Supervision of elderly multigravida, third trimester: Secondary | ICD-10-CM

## 2019-03-12 DIAGNOSIS — O09299 Supervision of pregnancy with other poor reproductive or obstetric history, unspecified trimester: Secondary | ICD-10-CM

## 2019-03-12 DIAGNOSIS — O0993 Supervision of high risk pregnancy, unspecified, third trimester: Secondary | ICD-10-CM

## 2019-03-12 DIAGNOSIS — J398 Other specified diseases of upper respiratory tract: Secondary | ICD-10-CM

## 2019-03-12 DIAGNOSIS — Z3A38 38 weeks gestation of pregnancy: Secondary | ICD-10-CM

## 2019-03-12 DIAGNOSIS — G5600 Carpal tunnel syndrome, unspecified upper limb: Secondary | ICD-10-CM | POA: Diagnosis not present

## 2019-03-12 DIAGNOSIS — O26899 Other specified pregnancy related conditions, unspecified trimester: Secondary | ICD-10-CM

## 2019-03-12 NOTE — Progress Notes (Signed)
   TELEHEALTH VIRTUAL OBSTETRICS VISIT ENCOUNTER NOTE  Clinic: [facility] for [facility]  I connected with Hannah Hamilton on 03/12/19 at  4:15 PM EDT by telephone at home and verified that I am speaking with the correct person using two identifiers.   I discussed the limitations, risks, security and privacy concerns of performing an evaluation and management service by telephone and the availability of in person appointments. I also discussed with the patient that there may be a patient responsible charge related to this service. The patient expressed understanding and agreed to proceed.  Subjective:  Hannah Hamilton is a 38 y.o. G3P2002 at [redacted]w[redacted]d being followed for ongoing prenatal care.  She is currently monitored for the following issues for this high-risk pregnancy and has Supervision of high risk pregnancy, antepartum; Hx of preeclampsia, prior pregnancy, currently pregnant; Advanced maternal age in multigravida; Previous cesarean delivery, antepartum; Carpal tunnel syndrome during pregnancy; and Congestion of upper respiratory tract on their problem list.  Patient reports 3-4d of s/s of congestion, ?SOB but no issues with moving around No fevers, chills, chest pain, cough.  Reports fetal movement. Denies any contractions, bleeding or leaking of fluid.   The following portions of the patient's history were reviewed and updated as appropriate: allergies, current medications, past family history, past medical history, past social history, past surgical history and problem list.   Objective:  There were no vitals filed for this visit.  Babyscripts Data Reviewed: yes  General:  Alert, oriented and cooperative.   Mental Status: Normal mood and affect perceived. Normal judgment and thought content.  Rest of physical exam deferred due to type of encounter  Assessment and Plan:  Pregnancy: G3P2002 at [redacted]w[redacted]d 1. Carpal tunnel syndrome during pregnancy  2. Hx of preeclampsia, prior  pregnancy, currently pregnant  3. Multigravida of advanced maternal age in third trimester  4. Previous cesarean delivery, antepartum Scheduled rpt and btl  5. Supervision of high risk pregnancy, antepartum  6. Congestion of upper respiratory tract ED precautions given Recommend quarentine and go to drive in testing site today  Term labor symptoms and general obstetric precautions including but not limited to vaginal bleeding, contractions, leaking of fluid and fetal movement were reviewed in detail with the patient.  I discussed the assessment and treatment plan with the patient. The patient was provided an opportunity to ask questions and all were answered. The patient agreed with the plan and demonstrated an understanding of the instructions. The patient was advised to call back or seek an in-person office evaluation/go to MAU at [facility] for any urgent or concerning symptoms. Please refer to After Visit Summary for other counseling recommendations.   I provided 10 minutes of non-face-to-face time during this encounter. The visit was conducted via MyChart-medicine  No follow-ups on file.  Future Appointments  Date Time Provider [place]  03/12/2019  4:15 PM Aletha Halim, MD [facility]  03/16/2019  8:10 AM [facility]U 1 [facility]IN[facility] None    Aletha Halim, MD Center for [employer], [place]

## 2019-03-12 NOTE — Progress Notes (Signed)
I connected with  Hannah Hamilton on 03/12/19 at  4:15 PM EDT by telephone and verified that I am speaking with the correct person using two identifiers.   I discussed the limitations, risks, security and privacy concerns of performing an evaluation and management service by telephone and the availability of in person appointments. I also discussed with the patient that there may be a patient responsible charge related to this service. The patient expressed understanding and agreed to proceed.  Crosby Oyster, RN 03/12/2019  3:18 PM

## 2019-03-13 ENCOUNTER — Emergency Department (HOSPITAL_COMMUNITY): Payer: Medicaid Other

## 2019-03-13 ENCOUNTER — Encounter (HOSPITAL_COMMUNITY): Payer: Self-pay | Admitting: Emergency Medicine

## 2019-03-13 ENCOUNTER — Emergency Department (HOSPITAL_COMMUNITY)
Admission: EM | Admit: 2019-03-13 | Discharge: 2019-03-13 | Disposition: A | Discharge status: Home / Self Care | Payer: Medicaid Other | Attending: Emergency Medicine | Admitting: Emergency Medicine

## 2019-03-13 DIAGNOSIS — J069 Acute upper respiratory infection, unspecified: Secondary | ICD-10-CM | POA: Insufficient documentation

## 2019-03-13 DIAGNOSIS — B9789 Other viral agents as the cause of diseases classified elsewhere: Secondary | ICD-10-CM | POA: Diagnosis not present

## 2019-03-13 DIAGNOSIS — F17210 Nicotine dependence, cigarettes, uncomplicated: Secondary | ICD-10-CM | POA: Diagnosis not present

## 2019-03-13 DIAGNOSIS — R059 Cough, unspecified: Secondary | ICD-10-CM

## 2019-03-13 DIAGNOSIS — O99513 Diseases of the respiratory system complicating pregnancy, third trimester: Secondary | ICD-10-CM | POA: Diagnosis not present

## 2019-03-13 DIAGNOSIS — O98913 Unspecified maternal infectious and parasitic disease complicating pregnancy, third trimester: Secondary | ICD-10-CM | POA: Diagnosis present

## 2019-03-13 DIAGNOSIS — Z3A38 38 weeks gestation of pregnancy: Secondary | ICD-10-CM | POA: Insufficient documentation

## 2019-03-13 DIAGNOSIS — Z20828 Contact with and (suspected) exposure to other viral communicable diseases: Secondary | ICD-10-CM | POA: Insufficient documentation

## 2019-03-13 DIAGNOSIS — R05 Cough: Secondary | ICD-10-CM

## 2019-03-13 DIAGNOSIS — Z79899 Other long term (current) drug therapy: Secondary | ICD-10-CM | POA: Diagnosis not present

## 2019-03-13 MED ORDER — ALBUTEROL SULFATE HFA 108 (90 BASE) MCG/ACT IN AERS: 2.0000 | INHALATION_SPRAY | RESPIRATORY_TRACT | 0 refills | Status: DC | PRN

## 2019-03-13 NOTE — ED Notes (Signed)
Patient was told by her dr to get a covid test drive thru, but if she got worse she should come to ED

## 2019-03-13 NOTE — Discharge Instructions (Addendum)
Please see attachment about upper respiratory infection. Please return to the ED if you have any severe shortness of breath or chest pain. As discussed, take symptomatic treatment as needed. Follow-up with your obgyn or PCP if your symptoms do not improve. Your COVID results should be available in a few days. You will get a phone call with the results. Take albuterol as needed for cough, wheezing, and shortness of breath. Take as prescribed.

## 2019-03-13 NOTE — ED Triage Notes (Addendum)
Pt reports nasal congestion and productive cough with green phlegm since Saturday, denies fevers or chills. Pt is scheduled for c section Monday, est due date 03/25/19. Denies any pregnancy related symptoms. Hx of pneumonia a few months ago, states this feels the same, used inhaler this am with some relief.

## 2019-03-13 NOTE — ED Provider Notes (Signed)
[facility] Provider Note   CSN: [id number] Arrival date & time: 03/13/19  1059     History   Chief Complaint Chief Complaint  Patient presents with  . Nasal Congestion    HPI Hannah Hamilton is a 38 y.o. G3P2  female currently 3738 weeks gestation with no significant past medical history who presents to the ED due to acute onset of nasal congestion and productive cough with green phlegm that started Saturday. Patient states her symptoms have progressively gotten worse over the past few days. She was treated for pneumonia on 01/27/2019 with a Z-pack which she completed. Her symptoms completely resolved after the treatment, but returned again on Saturday. Patient states her symptoms feel similar to when she had pneumonia. Productive cough is associated with sinus pressure and shortness of breath with exertion. She states her symptoms are worse at night. She has tried albuterol and Robitussin with moderate relief. She denies COVID-19 exposure. Patient denies vaginal bleeding or vaginal symptoms. She is still able to feel the baby move frequently. Patient has a scheduled c-section on Monday. Patient denies chest pain, ear symptoms, sore throat, fever, chills, nausea, vomiting, and diarrhea.   Past Medical History:  Diagnosis Date  . Pregnancy induced hypertension     Patient Active Problem List   Diagnosis Date Noted  . Congestion of upper respiratory tract 03/12/2019  . Carpal tunnel syndrome during pregnancy 10/24/2018  . Supervision of high risk pregnancy, antepartum 09/12/2018  . Hx of preeclampsia, prior pregnancy, currently pregnant 09/12/2018  . Advanced maternal age in multigravida 09/12/2018  . Previous cesarean delivery, antepartum 09/12/2018    Past Surgical History:  Procedure Laterality Date  . BARTHOLIN GLAND CYST EXCISION    . CESAREAN SECTION    . TONSILLECTOMY       OB History    Gravida  3   Para  2   Term  2   Preterm     AB      Living  2     SAB      TAB      Ectopic      Multiple      Live Births  2            Home Medications    Prior to Admission medications   Medication Sig Start Date End Date Taking? Authorizing Provider  albuterol (PROVENTIL HFA;VENTOLIN HFA) 108 (90 Base) MCG/ACT inhaler Inhale 1-2 puffs into the lungs every 6 (six) hours as needed for wheezing or shortness of breath. 08/02/17   Dowless, Lelon MastSamantha Tripp, PA-C  albuterol (VENTOLIN HFA) 108 (90 Base) MCG/ACT inhaler Inhale 2 puffs into the lungs every 4 (four) hours as needed for wheezing or shortness of breath (cough). 03/13/19   Cheek, Vesta Mixeraroline B, PA-C  aspirin EC 81 MG tablet Take 1 tablet (81 mg total) by mouth daily. Take after 12 weeks for prevention of preeclampsia later in pregnancy 09/12/18   Federico FlakeNewton, Kimberly Niles, MD  guaiFENesin (ROBITUSSIN) 100 MG/5ML liquid Take 200 mg by mouth 3 (three) times daily as needed for cough.    [provider]  Prenatal Vit-Fe Fumarate-FA (PREPLUS) 27-1 MG TABS Take 1 tablet by mouth daily. 09/13/18   Federico FlakeNewton, Kimberly Niles, MD    Family History Family History  Problem Relation Age of Onset  . Atrial fibrillation Mother   . Heart disease Mother     Social History Social History   Tobacco Use  . Smoking status: Current Every  Day Smoker    Packs/day: 0.50    Types: Cigarettes  . Smokeless tobacco: Never Used  Substance Use Topics  . Alcohol use: Yes    Comment: occasionally  . Drug use: No     Allergies   Patient has no known allergies.   Review of Systems Review of Systems  Constitutional: Negative for chills and fever.  HENT: Positive for congestion, postnasal drip, sinus pressure and sinus pain. Negative for ear discharge, ear pain, facial swelling, sore throat, trouble swallowing and voice change.   Eyes: Negative for pain and discharge.  Respiratory: Positive for cough (productive cough with green phlegm ), shortness of breath (only with  exertion) and wheezing.   Cardiovascular: Negative for chest pain, palpitations and leg swelling.  Gastrointestinal: Negative for diarrhea, nausea and vomiting.  Neurological: Negative for headaches.  All other systems reviewed and are negative.    Physical Exam Updated Vital Signs BP 110/60 (BP Location: Left Arm)   Pulse 70   Temp 98.1 F (36.7 C) (Oral)   Resp 16   LMP 06/18/2018 (Approximate)   SpO2 99%   Physical Exam Vitals signs and nursing note reviewed.  Constitutional:      General: She is not in acute distress.    Appearance: She is not toxic-appearing.  HENT:     Head: Normocephalic.     Nose: Congestion present.     Comments: Edematous and erythematous turbinates bilaterally    Mouth/Throat:     Mouth: Mucous membranes are moist.     Pharynx: Oropharynx is clear. No oropharyngeal exudate or posterior oropharyngeal erythema.  Eyes:     General:        Right eye: No discharge.        Left eye: No discharge.     Conjunctiva/sclera: Conjunctivae normal.     Pupils: Pupils are equal, round, and reactive to light.  Neck:     Musculoskeletal: Normal range of motion and neck supple.  Cardiovascular:     Rate and Rhythm: Normal rate and regular rhythm.     Pulses: Normal pulses.     Heart sounds: Normal heart sounds. No murmur. No friction rub. No gallop.   Pulmonary:     Effort: Pulmonary effort is normal. No respiratory distress.     Breath sounds: Wheezing (late expiratory wheeze heard throughout) and rhonchi (more noteable in left lung fields) present.  Chest:     Chest wall: No tenderness.  Abdominal:     Tenderness: There is no abdominal tenderness.     Comments: Patient is over 8 months pregnant  Musculoskeletal: Normal range of motion.  Skin:    General: Skin is warm and dry.  Neurological:     General: No focal deficit present.     Mental Status: She is alert.      ED Treatments / Results  Labs (all labs ordered are listed, but only abnormal  results are displayed) Labs Reviewed  NOVEL CORONAVIRUS, NAA (HOSP ORDER, SEND-OUT TO REF LAB; TAT 18-24 HRS)    EKG None  Radiology Dg Chest Port 1 View  Result Date: 03/13/2019 CLINICAL DATA:  Cough for 2-3 days. Current smoker. Seven months pregnant. EXAM: PORTABLE CHEST 1 VIEW COMPARISON:  01/26/2018 chest radiograph. FINDINGS: Stable cardiomediastinal silhouette with normal heart size. No pneumothorax. No pleural effusion. Lungs appear clear, with no acute consolidative airspace disease and no pulmonary edema. IMPRESSION: No active disease. Electronically Signed   By: Ilona Sorrel M.D.   On: 03/13/2019  13:33    Procedures Procedures (including critical care time)  Medications Ordered in ED Medications - No data to display   Initial Impression / Assessment and Plan / ED Course  I have reviewed the triage vital signs and the nursing notes.  Pertinent labs & imaging results that were available during my care of the patient were reviewed by me and considered in my medical decision making (see chart for details).  Hannah Hamilton is a 38 year old G3P2 [redacted] week gestation female who presents to the ED for an evaluation of productive cough. Patient was treated for pneumonia on 01/27/2019. Upon arrival, vitals all within normal limits. Patient is afebrile, HR 70 bpm, O2 saturation 99%, RR 16. Patient is coughing periodically during exam. On physical exam, patient is in no acute distress. Lung exam demonstrated late expiratory wheeze throughout with rhonchi more notable in left lung fields. Whitish, clear mucus noted in nostrils, but exam otherwise unremarkable.  Will obtain portable chest x-ray to check for pneumonia. Given the results of the x-ray will either order rapid COVID or outpatient COVID test. Chest x-ray was negative for signs of infection and pneumonia. Suspect patient's symptoms are related to a viral infection. Will obtain COVID-19 test. Patient informed that COVID-19 test will come  back in the next few days. She will be treated symptomatically for her cough and nasal congestion. Patient offered a list of medications she can take during pregnancy, but stated she was aware of what she can and cannot take. Patient sent home with another prescribed for albuterol. Strict ED return precautions discussed with patient. Patient states understanding and agrees to plan. Patient advised to follow-up with her OBGYN prior to her scheduled c-section on Monday. Patient also instructed to quarantine from the rest of her family until results come back. Patient given information on quarantine guidelines. Patient discharged home in not acute distress. Vitals all within normal limits.  Final Clinical Impressions(s) / ED Diagnoses   Final diagnoses:  Cough  Viral upper respiratory tract infection    ED Discharge Orders         Ordered    albuterol (VENTOLIN HFA) 108 (90 Base) MCG/ACT inhaler  Every 4 hours PRN     03/13/19 1402           Lorelle Formosa 03/13/19 1634    Maia Plan, MD 03/13/19 1843

## 2019-03-13 NOTE — ED Notes (Signed)
AVS provided.  Instructed to pick up prescription at pharmacy, Discharge teaching, No further questionsl.

## 2019-03-15 LAB — NOVEL CORONAVIRUS, NAA (HOSP ORDER, SEND-OUT TO REF LAB; TAT 18-24 HRS): SARS-CoV-2, NAA: NOT DETECTED

## 2019-03-16 ENCOUNTER — Other Ambulatory Visit (HOSPITAL_COMMUNITY)
Admission: RE | Admit: 2019-03-16 | Discharge: 2019-03-16 | Disposition: A | Discharge status: Home / Self Care | Payer: Medicaid Other | Source: Ambulatory Visit | Attending: Family Medicine | Admitting: Family Medicine

## 2019-03-16 ENCOUNTER — Other Ambulatory Visit: Payer: Self-pay

## 2019-03-16 DIAGNOSIS — Z20828 Contact with and (suspected) exposure to other viral communicable diseases: Secondary | ICD-10-CM | POA: Diagnosis not present

## 2019-03-16 DIAGNOSIS — Z01812 Encounter for preprocedural laboratory examination: Secondary | ICD-10-CM | POA: Insufficient documentation

## 2019-03-16 LAB — CBC
HCT: 34.1 % — ABNORMAL LOW (ref 36.0–46.0)
Hemoglobin: 11.5 g/dL — ABNORMAL LOW (ref 12.0–15.0)
MCH: 31.3 pg (ref 26.0–34.0)
MCHC: 33.7 g/dL (ref 30.0–36.0)
MCV: 92.9 fL (ref 80.0–100.0)
Platelets: 222 10*3/uL (ref 150–400)
RBC: 3.67 MIL/uL — ABNORMAL LOW (ref 3.87–5.11)
RDW: 12.8 % (ref 11.5–15.5)
WBC: 11.3 10*3/uL — ABNORMAL HIGH (ref 4.0–10.5)
nRBC: 0 % (ref 0.0–0.2)

## 2019-03-16 LAB — SARS CORONAVIRUS 2 BY RT PCR (HOSPITAL ORDER, PERFORMED IN ~~LOC~~ HOSPITAL LAB): SARS Coronavirus 2: NEGATIVE

## 2019-03-16 LAB — TYPE AND SCREEN
ABO/RH(D): AB POS
Antibody Screen: NEGATIVE

## 2019-03-16 LAB — ABO/RH: ABO/RH(D): AB POS

## 2019-03-16 LAB — RPR: RPR Ser Ql: NONREACTIVE

## 2019-03-16 NOTE — MAU Note (Signed)
Pt here for PAT covid swab, denies new onset of symptoms. Swab collected. Pt verbalizes understanding to not remove blood bank bracelet.

## 2019-03-18 ENCOUNTER — Inpatient Hospital Stay (HOSPITAL_COMMUNITY): Payer: Medicaid Other | Admitting: Anesthesiology

## 2019-03-18 ENCOUNTER — Encounter (HOSPITAL_COMMUNITY)
Admission: RE | Disposition: A | Discharge status: Home / Self Care | Payer: Self-pay | Source: Home / Self Care | Attending: Family Medicine

## 2019-03-18 ENCOUNTER — Other Ambulatory Visit: Payer: Self-pay

## 2019-03-18 ENCOUNTER — Encounter (HOSPITAL_COMMUNITY): Payer: Self-pay | Admitting: *Deleted

## 2019-03-18 ENCOUNTER — Inpatient Hospital Stay (HOSPITAL_COMMUNITY)
Admission: RE | Admit: 2019-03-18 | Discharge: 2019-03-20 | DRG: 785 | Disposition: A | Discharge status: Home / Self Care | Payer: Medicaid Other | Attending: Family Medicine | Admitting: Family Medicine

## 2019-03-18 DIAGNOSIS — Z302 Encounter for sterilization: Secondary | ICD-10-CM

## 2019-03-18 DIAGNOSIS — Z3A39 39 weeks gestation of pregnancy: Secondary | ICD-10-CM | POA: Diagnosis not present

## 2019-03-18 DIAGNOSIS — O09529 Supervision of elderly multigravida, unspecified trimester: Secondary | ICD-10-CM

## 2019-03-18 DIAGNOSIS — O34219 Maternal care for unspecified type scar from previous cesarean delivery: Secondary | ICD-10-CM | POA: Diagnosis present

## 2019-03-18 DIAGNOSIS — O34211 Maternal care for low transverse scar from previous cesarean delivery: Principal | ICD-10-CM | POA: Diagnosis present

## 2019-03-18 DIAGNOSIS — O09299 Supervision of pregnancy with other poor reproductive or obstetric history, unspecified trimester: Secondary | ICD-10-CM

## 2019-03-18 DIAGNOSIS — Z20828 Contact with and (suspected) exposure to other viral communicable diseases: Secondary | ICD-10-CM | POA: Diagnosis present

## 2019-03-18 DIAGNOSIS — O99334 Smoking (tobacco) complicating childbirth: Secondary | ICD-10-CM | POA: Diagnosis present

## 2019-03-18 DIAGNOSIS — F17210 Nicotine dependence, cigarettes, uncomplicated: Secondary | ICD-10-CM | POA: Diagnosis present

## 2019-03-18 DIAGNOSIS — Z98891 History of uterine scar from previous surgery: Secondary | ICD-10-CM

## 2019-03-18 LAB — CBC
HCT: 31 % — ABNORMAL LOW (ref 36.0–46.0)
Hemoglobin: 10.4 g/dL — ABNORMAL LOW (ref 12.0–15.0)
MCH: 31.7 pg (ref 26.0–34.0)
MCHC: 33.5 g/dL (ref 30.0–36.0)
MCV: 94.5 fL (ref 80.0–100.0)
Platelets: 210 10*3/uL (ref 150–400)
RBC: 3.28 MIL/uL — ABNORMAL LOW (ref 3.87–5.11)
RDW: 12.9 % (ref 11.5–15.5)
WBC: 18.1 10*3/uL — ABNORMAL HIGH (ref 4.0–10.5)
nRBC: 0 % (ref 0.0–0.2)

## 2019-03-18 LAB — CREATININE, SERUM
Creatinine, Ser: 0.52 mg/dL (ref 0.44–1.00)
GFR calc Af Amer: >60 mL/min (ref >60)
GFR calc non Af Amer: >60 mL/min (ref >60)

## 2019-03-18 SURGERY — Surgical Case
Anesthesia: Spinal | Laterality: Bilateral

## 2019-03-18 MED ORDER — KETOROLAC TROMETHAMINE 30 MG/ML IJ SOLN
30.0000 mg | Freq: Four times a day (QID) | INTRAMUSCULAR | Status: AC | PRN
  Administered 2019-03-19: 30 mg via INTRAVENOUS
  Filled 2019-03-18: qty 1

## 2019-03-18 MED ORDER — DIBUCAINE (PERIANAL) 1 % EX OINT: 1.0000 "application " | TOPICAL_OINTMENT | CUTANEOUS | Status: DC | PRN

## 2019-03-18 MED ORDER — LACTATED RINGERS IV SOLN
INTRAVENOUS | Status: DC
  Administered 2019-03-18 (×2): via INTRAVENOUS

## 2019-03-18 MED ORDER — LACTATED RINGERS IV SOLN
INTRAVENOUS | Status: DC | PRN
  Administered 2019-03-18 (×4): via INTRAVENOUS

## 2019-03-18 MED ORDER — KETOROLAC TROMETHAMINE 30 MG/ML IJ SOLN: 30.0000 mg | Freq: Four times a day (QID) | INTRAMUSCULAR | Status: AC | PRN

## 2019-03-18 MED ORDER — LACTATED RINGERS IV SOLN: INTRAVENOUS | Status: DC

## 2019-03-18 MED ORDER — FENTANYL CITRATE (PF) 100 MCG/2ML IJ SOLN
INTRAMUSCULAR | Status: DC | PRN
  Administered 2019-03-18: 15 ug via INTRATHECAL

## 2019-03-18 MED ORDER — PHENYLEPHRINE HCL-NACL 20-0.9 MG/250ML-% IV SOLN
INTRAVENOUS | Status: AC
  Filled 2019-03-18: qty 250

## 2019-03-18 MED ORDER — DIPHENHYDRAMINE HCL 25 MG PO CAPS
25.0000 mg | ORAL_CAPSULE | ORAL | Status: DC | PRN
  Administered 2019-03-19: 25 mg via ORAL
  Filled 2019-03-18: qty 1

## 2019-03-18 MED ORDER — MEPERIDINE HCL 25 MG/ML IJ SOLN: 6.2500 mg | INTRAMUSCULAR | Status: DC | PRN

## 2019-03-18 MED ORDER — ACETAMINOPHEN 500 MG PO TABS
1000.0000 mg | ORAL_TABLET | Freq: Four times a day (QID) | ORAL | Status: AC
  Administered 2019-03-18 – 2019-03-19 (×3): 1000 mg via ORAL
  Filled 2019-03-18 (×3): qty 2

## 2019-03-18 MED ORDER — ZOLPIDEM TARTRATE 5 MG PO TABS: 5.0000 mg | ORAL_TABLET | Freq: Every evening | ORAL | Status: DC | PRN

## 2019-03-18 MED ORDER — NALBUPHINE HCL 10 MG/ML IJ SOLN
5.0000 mg | INTRAMUSCULAR | Status: DC | PRN
  Filled 2019-03-18: qty 0.5

## 2019-03-18 MED ORDER — ACETAMINOPHEN 325 MG PO TABS
650.0000 mg | ORAL_TABLET | Freq: Four times a day (QID) | ORAL | Status: DC | PRN
  Administered 2019-03-19 – 2019-03-20 (×2): 650 mg via ORAL
  Filled 2019-03-18 (×2): qty 2

## 2019-03-18 MED ORDER — PHENYLEPHRINE 40 MCG/ML (10ML) SYRINGE FOR IV PUSH (FOR BLOOD PRESSURE SUPPORT)
PREFILLED_SYRINGE | INTRAVENOUS | Status: AC
  Filled 2019-03-18: qty 10

## 2019-03-18 MED ORDER — MORPHINE SULFATE (PF) 0.5 MG/ML IJ SOLN
INTRAMUSCULAR | Status: AC
  Filled 2019-03-18: qty 10

## 2019-03-18 MED ORDER — MORPHINE SULFATE (PF) 0.5 MG/ML IJ SOLN
INTRAMUSCULAR | Status: DC | PRN
  Administered 2019-03-18: .15 ug via INTRATHECAL

## 2019-03-18 MED ORDER — LACTATED RINGERS IV SOLN
INTRAVENOUS | Status: DC
  Administered 2019-03-18 – 2019-03-19 (×2): via INTRAVENOUS

## 2019-03-18 MED ORDER — SODIUM CHLORIDE 0.9% FLUSH: 3.0000 mL | INTRAVENOUS | Status: DC | PRN

## 2019-03-18 MED ORDER — DEXAMETHASONE SODIUM PHOSPHATE 4 MG/ML IJ SOLN
INTRAMUSCULAR | Status: AC
  Filled 2019-03-18: qty 7

## 2019-03-18 MED ORDER — FENTANYL CITRATE (PF) 100 MCG/2ML IJ SOLN
INTRAMUSCULAR | Status: AC
  Filled 2019-03-18: qty 2

## 2019-03-18 MED ORDER — SIMETHICONE 80 MG PO CHEW: 80.0000 mg | CHEWABLE_TABLET | ORAL | Status: DC | PRN

## 2019-03-18 MED ORDER — SENNOSIDES-DOCUSATE SODIUM 8.6-50 MG PO TABS
2.0000 | ORAL_TABLET | ORAL | Status: DC
  Administered 2019-03-18 – 2019-03-19 (×2): 2 via ORAL
  Filled 2019-03-18 (×2): qty 2

## 2019-03-18 MED ORDER — SODIUM CHLORIDE 0.9 % IV SOLN
INTRAVENOUS | Status: AC
  Filled 2019-03-18: qty 2

## 2019-03-18 MED ORDER — FENTANYL CITRATE (PF) 100 MCG/2ML IJ SOLN: 25.0000 ug | INTRAMUSCULAR | Status: DC | PRN

## 2019-03-18 MED ORDER — SIMETHICONE 80 MG PO CHEW
80.0000 mg | CHEWABLE_TABLET | Freq: Three times a day (TID) | ORAL | Status: DC
  Administered 2019-03-19 – 2019-03-20 (×4): 80 mg via ORAL
  Filled 2019-03-18 (×5): qty 1

## 2019-03-18 MED ORDER — COCONUT OIL OIL
1.0000 "application " | TOPICAL_OIL | Status: DC | PRN
  Administered 2019-03-19: 1 via TOPICAL

## 2019-03-18 MED ORDER — ONDANSETRON HCL 4 MG/2ML IJ SOLN
INTRAMUSCULAR | Status: AC
  Filled 2019-03-18: qty 2

## 2019-03-18 MED ORDER — METOCLOPRAMIDE HCL 5 MG/ML IJ SOLN
INTRAMUSCULAR | Status: DC | PRN
  Administered 2019-03-18: 10 mg via INTRAVENOUS

## 2019-03-18 MED ORDER — WITCH HAZEL-GLYCERIN EX PADS: 1.0000 "application " | MEDICATED_PAD | CUTANEOUS | Status: DC | PRN

## 2019-03-18 MED ORDER — SIMETHICONE 80 MG PO CHEW
80.0000 mg | CHEWABLE_TABLET | ORAL | Status: DC
  Administered 2019-03-18 – 2019-03-19 (×2): 80 mg via ORAL
  Filled 2019-03-18 (×2): qty 1

## 2019-03-18 MED ORDER — PHENYLEPHRINE HCL-NACL 20-0.9 MG/250ML-% IV SOLN
INTRAVENOUS | Status: DC | PRN
  Administered 2019-03-18: 60 ug/min via INTRAVENOUS

## 2019-03-18 MED ORDER — DM-GUAIFENESIN ER 30-600 MG PO TB12
1.0000 | ORAL_TABLET | Freq: Two times a day (BID) | ORAL | Status: DC
  Administered 2019-03-18 – 2019-03-20 (×5): 1 via ORAL
  Filled 2019-03-18 (×9): qty 1

## 2019-03-18 MED ORDER — NALBUPHINE HCL 10 MG/ML IJ SOLN
5.0000 mg | Freq: Once | INTRAMUSCULAR | Status: DC | PRN
  Filled 2019-03-18: qty 0.5

## 2019-03-18 MED ORDER — OXYTOCIN 40 UNITS IN NORMAL SALINE INFUSION - SIMPLE MED: 2.5000 [IU]/h | INTRAVENOUS | Status: AC

## 2019-03-18 MED ORDER — IBUPROFEN 600 MG PO TABS
600.0000 mg | ORAL_TABLET | Freq: Four times a day (QID) | ORAL | Status: DC | PRN
  Administered 2019-03-19: 600 mg via ORAL
  Filled 2019-03-18: qty 1

## 2019-03-18 MED ORDER — DEXAMETHASONE SODIUM PHOSPHATE 4 MG/ML IJ SOLN
INTRAMUSCULAR | Status: DC | PRN
  Administered 2019-03-18: 4 mg via INTRAVENOUS

## 2019-03-18 MED ORDER — OXYCODONE HCL 5 MG PO TABS
5.0000 mg | ORAL_TABLET | ORAL | Status: DC | PRN
  Administered 2019-03-18 – 2019-03-19 (×2): 5 mg via ORAL
  Administered 2019-03-19: 10 mg via ORAL
  Administered 2019-03-19: 5 mg via ORAL
  Administered 2019-03-20 (×4): 10 mg via ORAL
  Filled 2019-03-18: qty 2
  Filled 2019-03-18: qty 1
  Filled 2019-03-18 (×2): qty 2
  Filled 2019-03-18 (×2): qty 1
  Filled 2019-03-18 (×3): qty 2

## 2019-03-18 MED ORDER — SCOPOLAMINE 1 MG/3DAYS TD PT72
MEDICATED_PATCH | TRANSDERMAL | Status: AC
  Filled 2019-03-18: qty 1

## 2019-03-18 MED ORDER — OXYTOCIN 10 UNIT/ML IJ SOLN
INTRAMUSCULAR | Status: DC | PRN
  Administered 2019-03-18: 40 [IU]

## 2019-03-18 MED ORDER — PRENATAL MULTIVITAMIN CH
1.0000 | ORAL_TABLET | Freq: Every day | ORAL | Status: DC
  Administered 2019-03-19 – 2019-03-20 (×2): 1 via ORAL
  Filled 2019-03-18 (×2): qty 1

## 2019-03-18 MED ORDER — BUPIVACAINE IN DEXTROSE 0.75-8.25 % IT SOLN
INTRATHECAL | Status: DC | PRN
  Administered 2019-03-18: 1.4 mL via INTRATHECAL

## 2019-03-18 MED ORDER — METOCLOPRAMIDE HCL 5 MG/ML IJ SOLN: 10.0000 mg | Freq: Once | INTRAMUSCULAR | Status: DC | PRN

## 2019-03-18 MED ORDER — BUPIVACAINE HCL (PF) 0.25 % IJ SOLN
INTRAMUSCULAR | Status: AC
  Filled 2019-03-18: qty 30

## 2019-03-18 MED ORDER — DIPHENHYDRAMINE HCL 50 MG/ML IJ SOLN
INTRAMUSCULAR | Status: AC
  Filled 2019-03-18: qty 1

## 2019-03-18 MED ORDER — NALOXONE HCL 0.4 MG/ML IJ SOLN: 0.4000 mg | INTRAMUSCULAR | Status: DC | PRN

## 2019-03-18 MED ORDER — MENTHOL 3 MG MT LOZG: 1.0000 | LOZENGE | OROMUCOSAL | Status: DC | PRN

## 2019-03-18 MED ORDER — ONDANSETRON HCL 4 MG/2ML IJ SOLN: 4.0000 mg | Freq: Three times a day (TID) | INTRAMUSCULAR | Status: DC | PRN

## 2019-03-18 MED ORDER — MEPERIDINE HCL 25 MG/ML IJ SOLN
INTRAMUSCULAR | Status: DC | PRN
  Administered 2019-03-18 (×2): 12.5 mg via INTRAVENOUS

## 2019-03-18 MED ORDER — ONDANSETRON HCL 4 MG/2ML IJ SOLN
INTRAMUSCULAR | Status: DC | PRN
  Administered 2019-03-18: 4 mg via INTRAVENOUS

## 2019-03-18 MED ORDER — BENZONATATE 100 MG PO CAPS
200.0000 mg | ORAL_CAPSULE | Freq: Three times a day (TID) | ORAL | Status: DC
  Administered 2019-03-18 – 2019-03-20 (×6): 200 mg via ORAL
  Filled 2019-03-18 (×11): qty 2

## 2019-03-18 MED ORDER — METOCLOPRAMIDE HCL 5 MG/ML IJ SOLN
INTRAMUSCULAR | Status: AC
  Filled 2019-03-18: qty 2

## 2019-03-18 MED ORDER — TETANUS-DIPHTH-ACELL PERTUSSIS 5-2.5-18.5 LF-MCG/0.5 IM SUSP: 0.5000 mL | Freq: Once | INTRAMUSCULAR | Status: DC

## 2019-03-18 MED ORDER — DIPHENHYDRAMINE HCL 25 MG PO CAPS: 25.0000 mg | ORAL_CAPSULE | Freq: Four times a day (QID) | ORAL | Status: DC | PRN

## 2019-03-18 MED ORDER — OXYTOCIN 40 UNITS IN NORMAL SALINE INFUSION - SIMPLE MED
INTRAVENOUS | Status: AC
  Filled 2019-03-18: qty 1000

## 2019-03-18 MED ORDER — SODIUM CHLORIDE 0.9 % IV SOLN
INTRAVENOUS | Status: DC | PRN
  Administered 2019-03-18: 13:00:00 via INTRAVENOUS

## 2019-03-18 MED ORDER — ENOXAPARIN SODIUM 40 MG/0.4ML ~~LOC~~ SOLN
40.0000 mg | SUBCUTANEOUS | Status: DC
  Administered 2019-03-19 – 2019-03-20 (×2): 40 mg via SUBCUTANEOUS
  Filled 2019-03-18 (×2): qty 0.4

## 2019-03-18 MED ORDER — MEPERIDINE HCL 25 MG/ML IJ SOLN
INTRAMUSCULAR | Status: AC
  Filled 2019-03-18: qty 1

## 2019-03-18 MED ORDER — NALOXONE HCL 4 MG/10ML IJ SOLN
1.0000 ug/kg/h | INTRAVENOUS | Status: DC | PRN
  Filled 2019-03-18: qty 5

## 2019-03-18 MED ORDER — SCOPOLAMINE 1 MG/3DAYS TD PT72
1.0000 | MEDICATED_PATCH | Freq: Once | TRANSDERMAL | Status: DC
  Administered 2019-03-18: 1.5 mg via TRANSDERMAL

## 2019-03-18 MED ORDER — DIPHENHYDRAMINE HCL 50 MG/ML IJ SOLN
12.5000 mg | INTRAMUSCULAR | Status: DC | PRN
  Administered 2019-03-18 (×2): 12.5 mg via INTRAVENOUS
  Filled 2019-03-18: qty 1

## 2019-03-18 MED ORDER — SODIUM CHLORIDE 0.9 % IV SOLN
2.0000 g | INTRAVENOUS | Status: AC
  Administered 2019-03-18: 12:00:00 2 g via INTRAVENOUS

## 2019-03-18 MED ORDER — BUPIVACAINE HCL (PF) 0.25 % IJ SOLN
INTRAMUSCULAR | Status: DC | PRN
  Administered 2019-03-18: 30 mL

## 2019-03-18 SURGICAL SUPPLY — 39 items
BENZOIN TINCTURE PRP APPL 2/3 (GAUZE/BANDAGES/DRESSINGS) ×3 IMPLANT
CHLORAPREP W/TINT 26ML (MISCELLANEOUS) ×3 IMPLANT
CLAMP CORD UMBIL (MISCELLANEOUS) IMPLANT
CLOSURE WOUND 1/2 X4 (GAUZE/BANDAGES/DRESSINGS) ×1
CLOTH BEACON ORANGE TIMEOUT ST (SAFETY) ×3 IMPLANT
DRSG OPSITE POSTOP 4X10 (GAUZE/BANDAGES/DRESSINGS) ×3 IMPLANT
ELECT REM PT RETURN 9FT ADLT (ELECTROSURGICAL) ×3
ELECTRODE REM PT RTRN 9FT ADLT (ELECTROSURGICAL) ×1 IMPLANT
EXTRACTOR VACUUM M CUP 4 TUBE (SUCTIONS) IMPLANT
EXTRACTOR VACUUM M CUP 4' TUBE (SUCTIONS)
GAUZE SPONGE 4X4 12PLY STRL LF (GAUZE/BANDAGES/DRESSINGS) ×3 IMPLANT
GLOVE BIOGEL PI IND STRL 7.0 (GLOVE) ×3 IMPLANT
GLOVE BIOGEL PI INDICATOR 7.0 (GLOVE) ×6
GLOVE ECLIPSE 7.0 STRL STRAW (GLOVE) ×3 IMPLANT
GOWN STRL REUS W/TWL LRG LVL3 (GOWN DISPOSABLE) ×9 IMPLANT
KIT ABG SYR 3ML LUER SLIP (SYRINGE) IMPLANT
NEEDLE HYPO 22GX1.5 SAFETY (NEEDLE) ×3 IMPLANT
NEEDLE HYPO 25X5/8 SAFETYGLIDE (NEEDLE) IMPLANT
NS IRRIG 1000ML POUR BTL (IV SOLUTION) ×3 IMPLANT
PACK C SECTION WH (CUSTOM PROCEDURE TRAY) ×3 IMPLANT
PAD ABD 7.5X8 STRL (GAUZE/BANDAGES/DRESSINGS) ×3 IMPLANT
PAD ABD 8X10 STRL (GAUZE/BANDAGES/DRESSINGS) ×3 IMPLANT
PAD OB MATERNITY 4.3X12.25 (PERSONAL CARE ITEMS) ×3 IMPLANT
PENCIL SMOKE EVAC W/HOLSTER (ELECTROSURGICAL) ×3 IMPLANT
RTRCTR C-SECT PINK 25CM LRG (MISCELLANEOUS) IMPLANT
SPONGE GAUZE 4X4 12PLY STER LF (GAUZE/BANDAGES/DRESSINGS) ×3 IMPLANT
STRIP CLOSURE SKIN 1/2X4 (GAUZE/BANDAGES/DRESSINGS) ×2 IMPLANT
SUT MNCRL 0 VIOLET CTX 36 (SUTURE) ×1 IMPLANT
SUT MONOCRYL 0 CTX 36 (SUTURE) ×2
SUT PLAIN 2 0 (SUTURE) ×4
SUT PLAIN ABS 2-0 CT1 27XMFL (SUTURE) ×2 IMPLANT
SUT VIC AB 0 CTX 36 (SUTURE) ×6
SUT VIC AB 0 CTX36XBRD ANBCTRL (SUTURE) ×3 IMPLANT
SUT VIC AB 4-0 KS 27 (SUTURE) ×3 IMPLANT
SYR 30ML LL (SYRINGE) ×3 IMPLANT
TAPE CLOTH SURG 4X10 WHT LF (GAUZE/BANDAGES/DRESSINGS) ×3 IMPLANT
TOWEL OR 17X24 6PK STRL BLUE (TOWEL DISPOSABLE) ×3 IMPLANT
TRAY FOLEY W/BAG SLVR 14FR LF (SET/KITS/TRAYS/PACK) ×3 IMPLANT
WATER STERILE IRR 1000ML POUR (IV SOLUTION) ×3 IMPLANT

## 2019-03-18 NOTE — Anesthesia Preprocedure Evaluation (Signed)
Anesthesia Evaluation  Patient identified by MRN, date of birth, ID band Patient awake    Reviewed: Allergy & Precautions, NPO status , Patient's Chart, lab work & pertinent test results  Airway Mallampati: II  TM Distance: >3 FB Neck ROM: Full    Dental no notable dental hx.    Pulmonary neg pulmonary ROS, Current Smoker,    Pulmonary exam normal breath sounds clear to auscultation       Cardiovascular hypertension, Normal cardiovascular exam Rhythm:Regular Rate:Normal     Neuro/Psych negative neurological ROS  negative psych ROS   GI/Hepatic negative GI ROS, Neg liver ROS,   Endo/Other  negative endocrine ROS  Renal/GU negative Renal ROS  negative genitourinary   Musculoskeletal negative musculoskeletal ROS (+)   Abdominal   Peds negative pediatric ROS (+)  Hematology negative hematology ROS (+)   Anesthesia Other Findings   Reproductive/Obstetrics (+) Pregnancy (PIH)                             Anesthesia Physical Anesthesia Plan  ASA: II  Anesthesia Plan: Spinal   Post-op Pain Management:    Induction:   PONV Risk Score and Plan: 1 and Ondansetron and Treatment may vary due to age or medical condition  Airway Management Planned: Natural Airway  Additional Equipment:   Intra-op Plan:   Post-operative Plan:   Informed Consent: I have reviewed the patients History and Physical, chart, labs and discussed the procedure including the risks, benefits and alternatives for the proposed anesthesia with the patient or authorized representative who has indicated his/her understanding and acceptance.     Dental advisory given  Plan Discussed with:   Anesthesia Plan Comments:         Anesthesia Quick Evaluation

## 2019-03-18 NOTE — Op Note (Signed)
Hannah Hamilton PROCEDURE DATE: 03/18/2019  PREOPERATIVE DIAGNOSES: Intrauterine pregnancy at [redacted]w[redacted]d weeks gestation; elective repeat; undesired fertility  POSTOPERATIVE DIAGNOSES: The same  PROCEDURE: Repeat Low Transverse Cesarean Section, Bilateral Salpingectomy  SURGEON:  Dr. Tinnie Gens  ASSISTANT:  Jerilynn Birkenhead, MD An experienced assistant was required given the standard of surgical care given the complexity of the case.  This assistant was needed for exposure, dissection, suctioning, retraction, instrument exchange, assisting with delivery with administration of fundal pressure, and for overall help during the procedure.  ANESTHESIOLOGIST: Dr. Rosana Hoes  INDICATIONS: Hannah Hamilton is a 38 y.o. G3P2002 at [redacted]w[redacted]d here for cesarean section and bilateral tubal sterilization secondary to the indications listed under preoperative diagnoses; please see preoperative note for further details.  The risks of surgery were discussed with the patient including but were not limited to: bleeding which may require transfusion or reoperation; infection which may require antibiotics; injury to bowel, bladder, ureters or other surrounding organs; injury to the fetus; need for additional procedures including hysterectomy in the event of a life-threatening hemorrhage; placental abnormalities wth subsequent pregnancies, incisional problems, thromboembolic phenomenon and other postoperative/anesthesia complications.  Patient desires permanent sterilization.  Other reversible forms of contraception were discussed with patient; she declines all other modalities. Risks of procedure discussed with patient including but not limited to: risk of regret, permanence of method, bleeding, infection, injury to surrounding organs and need for additional procedures.  Failure risk of about 1% with increased risk of ectopic gestation if pregnancy occurs was also discussed with patient.  Also discussed possibility of post-tubal pain syndrome.  Patient verbalized understanding of these risks and wants to proceed with sterilization.   FINDINGS:  Viable female infant in cephalic presentation. Clear amniotic fluid.  Intact placenta, three vessel cord.  Normal uterus, fallopian tubes and ovaries bilaterally. Fallopian tubes were sterilized bilaterally. APGAR (1 MIN): 8 APGAR (5 MINS): 9  APGAR (10 MINS):    ANESTHESIA: Spinal INTRAVENOUS FLUIDS: 3400 ml ESTIMATED BLOOD LOSS: 1314 ml URINE OUTPUT:  150 ml SPECIMENS: Placenta sent to L&D and bilateral fallopian tubes sent to pathology COMPLICATIONS: None immediate  PROCEDURE IN DETAIL:  The patient preoperatively received intravenous antibiotics and had sequential compression devices applied to her lower extremities.   She was then taken to the operating room where spinal anesthesia was administered and was found to be adequate. She was then placed in a dorsal supine position with a leftward tilt, and prepped and draped in a sterile manner.  A foley catheter was placed into her bladder and attached to constant gravity.  After an adequate timeout was performed, a Pfannenstiel skin incision was made with scalpel over her preexisting scar and carried through to the underlying layer of fascia. The fascia was incised in the midline, and this incision was extended bilaterally bluntly. The rectus muscles were separated in the midline and the peritoneum was entered sharply then bluntly. The Alexis self-retaining retractor was introduced into the abdominal cavity.  Attention was turned to the lower uterine segment where a low transverse hysterotomy was made with a scalpel and extended bilaterally bluntly.  The infant was successfully delivered, the cord was clamped and cut after one minute, and the infant was handed over to the awaiting neonatology team. Uterine massage was then administered, and the placenta delivered intact with a three-vessel cord. The uterus was then cleared of clots and debris.  The  hysterotomy was closed with 0 Monocryl in a running locked fashion, and an imbricating layer was also placed with 0  Vicryl. Attention was then turned to the fallopian tubes, The left then right fallopian tubes were clamped cut and excised. The pedicle was doubly suture ligated with 2-0 plain gut. Excellent hemostasis was noted. The pelvis was cleared of all clot and debris. Hemostasis was confirmed on all surfaces.  The retractor was removed.  The peritoneum was closed with a 0 Vicryl running stitch. The fascia was then closed using 0 Vicryl in a running fashion.  The subcutaneous layer was irrigated, reapproximated with 0 Vicryl interrupted stitches, and the skin was closed with a 4-0 Vicryl subcuticular stitch. 30 mL 0.5% Marcaine injected around incision site. The patient tolerated the procedure well. Sponge, instrument and needle counts were correct x 3.  She was taken to the recovery room in stable condition.   Barrington Ellison, MD [facility] Family Medicine Fellow, [facility] for [employer], [place]

## 2019-03-18 NOTE — Anesthesia Procedure Notes (Signed)
Spinal  Patient location during procedure: OR Staffing Anesthesiologist: Lyndee Herbst, MD Performed: anesthesiologist  Preanesthetic Checklist Completed: patient identified, site marked, surgical consent, pre-op evaluation, timeout performed, IV checked, risks and benefits discussed and monitors and equipment checked Spinal Block Patient position: sitting Prep: DuraPrep Patient monitoring: heart rate, continuous pulse ox and blood pressure Approach: midline Location: L3-4 Injection technique: single-shot Needle Needle type: Sprotte  Needle gauge: 24 G Needle length: 9 cm Additional Notes Expiration date of kit checked and confirmed. Patient tolerated procedure well, without complications.       

## 2019-03-18 NOTE — Anesthesia Postprocedure Evaluation (Signed)
Anesthesia Post Note  Patient: Health visitor  Procedure(s) Performed: CESAREAN SECTION WITH BILATERAL TUBAL LIGATION (Bilateral )     Patient location during evaluation: PACU Anesthesia Type: Spinal Level of consciousness: awake and alert Pain management: pain level controlled Vital Signs Assessment: post-procedure vital signs reviewed and stable Respiratory status: spontaneous breathing and respiratory function stable Cardiovascular status: blood pressure returned to baseline and stable Postop Assessment: no headache, no backache, spinal receding and no apparent nausea or vomiting Anesthetic complications: no    Last Vitals:  Vitals:   03/18/19 1445 03/18/19 1453  BP: 105/69 106/70  Pulse: (!) 55 (!) 58  Resp: 16 20  Temp:  36.9 C  SpO2: 95% 98%    Last Pain:  Vitals:   03/18/19 1453  TempSrc: Oral  PainSc:    Pain Goal:                   Montez Hageman

## 2019-03-18 NOTE — Transfer of Care (Signed)
Immediate Anesthesia Transfer of Care Note  Patient: Hannah Hamilton  Procedure(s) Performed: CESAREAN SECTION WITH BILATERAL TUBAL LIGATION (Bilateral )  Patient Location: PACU  Anesthesia Type:Spinal  Level of Consciousness: awake, alert  and oriented  Airway & Oxygen Therapy: Patient Spontanous Breathing  Post-op Assessment: Report given to RN and Post -op Vital signs reviewed and stable  Post vital signs: Reviewed and stable  Last Vitals:  Vitals Value Taken Time  BP    Temp    Pulse    Resp    SpO2      Last Pain:  Vitals:   03/18/19 1052  TempSrc: Oral         Complications: No apparent anesthesia complications

## 2019-03-18 NOTE — H&P (Signed)
Obstetric Preoperative History and Physical  Viviana Friend is a 38 y.o. G4W1027 with IUP at [redacted]w[redacted]d presenting for scheduled cesarean section.  Reports good fetal movement, no bleeding, no contractions, no leaking of fluid.  No acute preoperative concerns.  Reports a mild cough and worried about abdominal pain post-section.  Cesarean Section Indication: 2 prior c-sections  Prenatal Course Source of Care: [place]  with onset of care at 12 weeks Pregnancy complications or risks: Patient Active Problem List   Diagnosis Date Noted  . [redacted] weeks gestation of pregnancy 03/18/2019  . Congestion of upper respiratory tract 03/12/2019  . Carpal tunnel syndrome during pregnancy 10/24/2018  . Supervision of high risk pregnancy, antepartum 09/12/2018  . Hx of preeclampsia, prior pregnancy, currently pregnant 09/12/2018  . Advanced maternal age in multigravida 09/12/2018  . Previous cesarean delivery, antepartum 09/12/2018   She plans to bottle feed She desires bilateral tubal ligation for postpartum contraception.   Prenatal labs and studies: ABO, Rh: --/--/AB POS, AB POS Performed at [facility], [address], [place], [place] [address]  [phone]) Antibody: NEG (10/17 5956) Rubella: 15.00 (04/15 1029) RPR: NON REACTIVE (10/17 0922)  HBsAg: Negative (04/15 1029)  HIV: Non Reactive (08/05 0833)  LOV:FIEPPIRJ/-- (10/01 0940) 2 hr Glucola  WNL Genetic screening normal Anatomy US normal  Prenatal Transfer Tool  Maternal Diabetes: No Genetic Screening: Normal Maternal Ultrasounds/Referrals: Normal Fetal Ultrasounds or other Referrals:  None Maternal Substance Abuse:  No Significant Maternal Medications:  None Significant Maternal Lab Results: Group B Strep negative  Past Medical History:  Diagnosis Date  . Pregnancy induced hypertension     Past Surgical History:  Procedure Laterality Date  . BARTHOLIN GLAND CYST EXCISION    . CESAREAN SECTION    . TONSILLECTOMY      OB  History  Gravida Para Term Preterm AB Living  3 2 2     2   SAB TAB Ectopic Multiple Live Births          2    # Outcome Date GA Lbr Len/2nd Weight Sex Delivery Anes PTL Lv  3 Current           2 Term 10/29/12    F CS-LTranv   LIV     Birth Comments:  pp pre eclampsia     Complications: Preeclampsia in postpartum period  1 Term 03/02/11    F CS-LTranv   LIV     Complications: Failure to Progress in First Stage    Social History   Socioeconomic History  . Marital status: Single    Spouse name: Not on file  . Number of children: Not on file  . Years of education: Not on file  . Highest education level: Not on file  Occupational History  . Not on file  Social Needs  . Financial resource strain: Not on file  . Food insecurity    Worry: Not on file    Inability: Not on file  . Transportation needs    Medical: Not on file    Non-medical: Not on file  Tobacco Use  . Smoking status: Current Every Day Smoker    Packs/day: 0.50    Types: Cigarettes  . Smokeless tobacco: Never Used  Substance and Sexual Activity  . Alcohol use: Yes    Comment: occasionally  . Drug use: No  . Sexual activity: Yes    Birth control/protection: None  Lifestyle  . Physical activity    Days per week: Not on  file    Minutes per session: Not on file  . Stress: Not on file  Relationships  . Social Musician on phone: Not on file    Gets together: Not on file    Attends religious service: Not on file    Active member of club or organization: Not on file    Attends meetings of clubs or organizations: Not on file    Relationship status: Not on file  Other Topics Concern  . Not on file  Social History Narrative  . Not on file    Family History  Problem Relation Age of Onset  . Atrial fibrillation Mother   . Heart disease Mother     Medications Prior to Admission  Medication Sig Dispense Refill Last Dose  . albuterol (PROVENTIL HFA;VENTOLIN HFA) 108 (90 Base) MCG/ACT inhaler  Inhale 1-2 puffs into the lungs every 6 (six) hours as needed for wheezing or shortness of breath. 1 Inhaler 0 03/17/2019 at Unknown time  . albuterol (VENTOLIN HFA) 108 (90 Base) MCG/ACT inhaler Inhale 2 puffs into the lungs every 4 (four) hours as needed for wheezing or shortness of breath (cough). 18 g 0 03/17/2019 at Unknown time  . aspirin EC 81 MG tablet Take 1 tablet (81 mg total) by mouth daily. Take after 12 weeks for prevention of preeclampsia later in pregnancy 300 tablet 2 03/17/2019 at Unknown time  . guaiFENesin (ROBITUSSIN) 100 MG/5ML liquid Take 200 mg by mouth 3 (three) times daily as needed for cough.     . Prenatal Vit-Fe Fumarate-FA (PREPLUS) 27-1 MG TABS Take 1 tablet by mouth daily. 30 tablet 13 03/17/2019 at Unknown time    No Known Allergies  Review of Systems: Pertinent items noted in HPI and remainder of comprehensive ROS otherwise negative.  Physical Exam: BP 113/67   Pulse 78   Temp 98.2 F (36.8 C) (Oral)   Resp 18   Ht  (1.626 m)   Wt 95.7 kg   LMP 06/18/2018 (Approximate)   SpO2 99%   BMI 36.22 kg/m  FHR by Doppler: 141 bpm CONSTITUTIONAL: Well-developed, well-nourished female in no acute distress.  HENT:  Normocephalic, atraumatic, External right and left ear normal. Oropharynx is clear and moist EYES: Conjunctivae and EOM are normal. No scleral icterus.  NECK: Normal range of motion, supple, no masses SKIN: Skin is warm and dry. No rash noted. Not diaphoretic. No erythema. No pallor. NEUROLGIC: Alert and oriented to person, place, and time. Normal reflexes, muscle tone coordination. No cranial nerve deficit noted. PSYCHIATRIC: Normal mood and affect. Normal behavior. Normal judgment and thought content. CARDIOVASCULAR: Normal heart rate noted RESPIRATORY: Effort and breath sounds normal, no problems with respiration noted ABDOMEN: Soft, nontender, nondistended, gravid. Well-healed Pfannenstiel incision. PELVIC: Deferred MUSCULOSKELETAL: Normal  range of motion. No edema and no tenderness. 2+ distal pulses.   Pertinent Labs/Studies:   Results for orders placed or performed during the hospital encounter of 03/16/19 (from the past 72 hour(s))  SARS Coronavirus 2 by RT PCR (hospital order, performed in [facility] hospital lab) Nasopharyngeal Nasopharyngeal Swab     Status: None   Collection Time: 03/16/19  8:33 AM   Specimen: Nasopharyngeal Swab  Result Value Ref Range   SARS Coronavirus 2 NEGATIVE NEGATIVE    Comment: (NOTE) If result is NEGATIVE SARS-CoV-2 target nucleic acids are NOT DETECTED. The SARS-CoV-2 RNA is generally detectable in upper and lower  respiratory specimens during the acute phase of infection. The lowest  concentration of SARS-CoV-2 viral copies this assay can detect is 250  copies / mL. A negative result does not preclude SARS-CoV-2 infection  and should not be used as the sole basis for treatment or other  patient management decisions.  A negative result may occur with  improper specimen collection / handling, submission of specimen other  than nasopharyngeal swab, presence of viral mutation(s) within the  areas targeted by this assay, and inadequate number of viral copies  (<250 copies / mL). A negative result must be combined with clinical  observations, patient history, and epidemiological information. If result is POSITIVE SARS-CoV-2 target nucleic acids are DETECTED. The SARS-CoV-2 RNA is generally detectable in upper and lower  respiratory specimens dur ing the acute phase of infection.  Positive  results are indicative of active infection with SARS-CoV-2.  Clinical  correlation with patient history and other diagnostic information is  necessary to determine patient infection status.  Positive results do  not rule out bacterial infection or co-infection with other viruses. If result is PRESUMPTIVE POSTIVE SARS-CoV-2 nucleic acids MAY BE PRESENT.   A presumptive positive result was obtained on  the submitted specimen  and confirmed on repeat testing.  While 2019 novel coronavirus  (SARS-CoV-2) nucleic acids may be present in the submitted sample  additional confirmatory testing may be necessary for epidemiological  and / or clinical management purposes  to differentiate between  SARS-CoV-2 and other Sarbecovirus currently known to infect humans.  If clinically indicated additional testing with an alternate test  methodology [phone]) is advised. The SARS-CoV-2 RNA is generally  detectable in upper and lower respiratory sp ecimens during the acute  phase of infection. The expected result is Negative. Fact Sheet for Patients:  StrictlyIdeas.no Fact Sheet for Healthcare Providers: BankingDealers.co.za This test is not yet approved or cleared by the [place] FDA and has been authorized for detection and/or diagnosis of SARS-CoV-2 by FDA under an Emergency Use Authorization (EUA).  This EUA will remain in effect (meaning this test can be used) for the duration of the COVID-19 declaration under Section 564(b)(1) of the Act, 21 U.S.C. section 360bbb-3(b)(1), unless the authorization is terminated or revoked sooner. Performed at [facility], [place] [address] [place], [place] [address]   Type and screen [place]     Status: None   Collection Time: 03/16/19  9:22 AM  Result Value Ref Range   ABO/RH(D) AB POS    Antibody Screen NEG    Sample Expiration      03/19/2019,2359 Performed at [facility], [place] [address], [place], [place] [address]   CBC     Status: Abnormal   Collection Time: 03/16/19  9:22 AM  Result Value Ref Range   WBC 11.3 (H) 4.0 - 10.5 K/uL   RBC 3.67 (L) 3.87 - 5.11 MIL/uL   Hemoglobin 11.5 (L) 12.0 - 15.0 g/dL   HCT 34.1 (L) 36.0 - 46.0 %   MCV 92.9 80.0 - 100.0 fL   MCH 31.3 26.0 - 34.0 pg   MCHC 33.7 30.0 - 36.0 g/dL   RDW 12.8 11.5 - 15.5 %   Platelets 222 150 - 400  K/uL   nRBC 0.0 0.0 - 0.2 %    Comment: Performed at [facility], [place] [address] [place], [place] [address]  RPR     Status: None   Collection Time: 03/16/19  9:22 AM  Result Value Ref Range   RPR Ser Ql NON  REACTIVE NON REACTIVE    Comment: Performed at [facility], [address] [place], [place] [address]  ABO/Rh     Status: None   Collection Time: 03/16/19  9:22 AM  Result Value Ref Range   ABO/RH(D)      AB POS Performed at [facility], [address] [place], [place] [address]     Assessment and Plan: Janeann ForehandFaith Kydd is a 38 y.o. G3P2002 at 2326w0d being admitted for scheduled cesarean section. The risks of cesarean section discussed with the patient included but were not limited to: bleeding which may require transfusion or reoperation; infection which may require antibiotics; injury to bowel, bladder, ureters or other surrounding organs; injury to the fetus; need for additional procedures including hysterectomy in the event of a life-threatening hemorrhage; placental abnormalities with subsequent pregnancies, incisional problems, thromboembolic phenomenon and other postoperative/anesthesia complications. The patient concurred with the proposed plan, giving informed written consent for the procedure. Patient has been NPO since last night she will remain NPO for procedure. Anesthesia and OR aware. Preoperative prophylactic antibiotics and SCDs ordered on call to the OR. To OR when ready.   Pregnancy Complications: hx of Pre-E, 2 prior C-sections Contraception: BTL; paperwork signed 8/5  Jerilynn Birkenheadhelsea Franciscojavier Wronski, MD [facility] Fellow, [facility] for [facility], [facility]

## 2019-03-18 NOTE — Discharge Summary (Signed)
Postpartum Discharge Summary     Patient Name: Hannah Hamilton DOB: [birth year] MRN: [id number]  Date of admission: 03/18/2019 Delivering Provider: Donnamae Jude   Date of discharge: 03/20/2019  Admitting diagnosis: RCS Undesired Fertility Intrauterine pregnancy: [redacted]w[redacted]d    Secondary diagnosis:  Active Problems:   Hx of preeclampsia, prior pregnancy, currently pregnant   Advanced maternal age in multigravida   Previous cesarean delivery, antepartum   [redacted] weeks gestation of pregnancy   Labor and delivery, indication for care   History of cesarean delivery  Additional problems: None     Discharge diagnosis: Term Pregnancy Delivered                                                                                                Post partum procedures:None  Augmentation: None  Complications: None  Hospital course:  Sceduled C/S   38y.o. yo G3P3003 at 3600w0das admitted to the hospital 03/18/2019 for scheduled cesarean section with the following indication:Elective Repeat. Bilateral elective salpingectomy done at time of operation for undesired fertility. Membrane Rupture Time/Date: 12:52 PM ,03/18/2019   Patient delivered a Viable infant.03/18/2019  Details of operation can be found in separate operative note.  Pateint had an uncomplicated postpartum course.  She is ambulating, tolerating a regular diet, passing flatus, and urinating well. Patient is discharged home in stable condition on  03/20/19        Delivery time: 12:53 PM    Magnesium Sulfate received: No BMZ received: No Rhophylac:No MMR:No Transfusion:No  Physical exam  Vitals:   03/19/19 1112 03/19/19 1406 03/19/19 2314 03/20/19 0619  BP: 111/66 105/64 108/84 (!) 119/59  Pulse: 98 (!) 58 65 80  Resp: _0 Temp: 98.9 F (37.2 C) 98.4 F (36.9 C) 98.2 F (36.8 C) 98.1 F (36.7 C)  TempSrc: Oral Oral Oral Oral  SpO2: 96% 99%  99%  Weight:      Height:       General: alert, cooperative and no  distress Lochia: appropriate Uterine Fundus: firm Incision: Healing well with no significant drainage, No significant erythema, Dressing is clean, dry, and intact DVT Evaluation: No evidence of DVT seen on physical exam. Negative Homan's sign. No cords or calf tenderness. No significant calf/ankle edema. Labs: Lab Results  Component Value Date   WBC 16.4 (H) 03/19/2019   HGB 9.1 (L) 03/19/2019   HCT 25.8 (L) 03/19/2019   MCV 93.1 03/19/2019   PLT 185 03/19/2019   CMP Latest Ref Rng & Units 03/18/2019  Glucose 70 - 99 mg/dL -  BUN 6 - 20 mg/dL -  Creatinine 0.44 - 1.00 mg/dL 0.52  Sodium 135 - 145 mmol/L -  Potassium 3.5 - 5.1 mmol/L -  Chloride 98 - 111 mmol/L -  CO2 22 - 32 mmol/L -  Calcium 8.9 - 10.3 mg/dL -  Total Protein 6.5 - 8.1 g/dL -  Total Bilirubin 0.3 - 1.2 mg/dL -  Alkaline Phos 38 - 126 U/L -  AST 15 - 41 U/L -  ALT 14 - 54 U/L -    Discharge  instruction: per After Visit Summary and "Baby and Me Booklet".  After visit meds:  Allergies as of 03/20/2019   No Known Allergies     Medication List    STOP taking these medications   aspirin EC 81 MG tablet     TAKE these medications   acetaminophen 325 MG tablet Commonly known as: TYLENOL Take 2 tablets (650 mg total) by mouth every 6 (six) hours as needed for mild pain (temperature > 101.5.).   albuterol 108 (90 Base) MCG/ACT inhaler Commonly known as: VENTOLIN HFA Inhale 1-2 puffs into the lungs every 6 (six) hours as needed for wheezing or shortness of breath.   albuterol 108 (90 Base) MCG/ACT inhaler Commonly known as: VENTOLIN HFA Inhale 2 puffs into the lungs every 4 (four) hours as needed for wheezing or shortness of breath (cough).   guaiFENesin 100 MG/5ML liquid Commonly known as: ROBITUSSIN Take 200 mg by mouth 3 (three) times daily as needed for cough.   ibuprofen 600 MG tablet Commonly known as: ADVIL Take 1 tablet (600 mg total) by mouth every 6 (six) hours as needed for fever or  headache.   oxyCODONE 5 MG immediate release tablet Commonly known as: Oxy IR/ROXICODONE Take 1-2 tablets (5-10 mg total) by mouth every 4 (four) hours as needed for moderate pain.   PrePLUS 27-1 MG Tabs Take 1 tablet by mouth daily.       Diet: routine diet  Activity: Advance as tolerated. Pelvic rest for 6 weeks.   Outpatient follow up:4 weeks Follow up Appt: Future Appointments  Date Time Provider [place]  04/01/2019  1:15 PM [facility] NURSE [facility]  04/16/2019  2:15 PM Emily Filbert, MD [facility]   Follow up Visit:   Please schedule this patient for Postpartum visit in: 4 weeks with the following provider: Any provider For C/S patients schedule nurse incision check in weeks 2 weeks: yes High risk pregnancy complicated by: 2 prior sections Delivery mode:  CS Anticipated Birth Control:  BTL done PP PP Procedures needed: Incision check  Schedule Integrated BH visit: no  Newborn Data: Live born female  Birth Weight: 3415 g APGAR (1 MIN): 8 APGAR (5 MINS): 9    Newborn Delivery   Birth date/time: 03/18/2019 12:53:00 Delivery type: C-Section, Low Transverse Trial of labor: No C-section categorization: Repeat      Baby Feeding: Bottle Disposition:home with mother   03/20/2019 Merilyn Baba, DO

## 2019-03-19 ENCOUNTER — Telehealth: Payer: Self-pay | Admitting: Radiology

## 2019-03-19 DIAGNOSIS — Z98891 History of uterine scar from previous surgery: Secondary | ICD-10-CM

## 2019-03-19 LAB — SURGICAL PATHOLOGY

## 2019-03-19 LAB — CBC
HCT: 25.8 % — ABNORMAL LOW (ref 36.0–46.0)
Hemoglobin: 9.1 g/dL — ABNORMAL LOW (ref 12.0–15.0)
MCH: 32.9 pg (ref 26.0–34.0)
MCHC: 35.3 g/dL (ref 30.0–36.0)
MCV: 93.1 fL (ref 80.0–100.0)
Platelets: 185 10*3/uL (ref 150–400)
RBC: 2.77 MIL/uL — ABNORMAL LOW (ref 3.87–5.11)
RDW: 12.9 % (ref 11.5–15.5)
WBC: 16.4 10*3/uL — ABNORMAL HIGH (ref 4.0–10.5)
nRBC: 0 % (ref 0.0–0.2)

## 2019-03-19 LAB — BIRTH TISSUE RECOVERY COLLECTION (PLACENTA DONATION)

## 2019-03-19 NOTE — Progress Notes (Addendum)
Subjective: Postpartum Day 1: Cesarean Delivery Patient reports tolerating PO.    Objective: Vital signs in last 24 hours: Temp:  [97.4 F (36.3 C)-99 F (37.2 C)] 98.7 F (37.1 C) (10/20 0315) Pulse Rate:  [52-83] 52 (10/20 0315) Resp:  [16-20] 17 (10/20 0315) BP: (101-131)/(58-74) 102/58 (10/20 0315) SpO2:  [95 %-100 %] 97 % (10/20 0315) Weight:  [95.7 kg] 95.7 kg (10/19 1052)  Physical Exam:  General: alert and no distress Lochia: appropriate Uterine Fundus: firm Incision:  Covered with dressing. No drainage noted through the dressing DVT Evaluation: No evidence of DVT seen on physical exam. Negative Homan's sign. No cords or calf tenderness.  Recent Labs    03/18/19 1605 03/19/19 0509  HGB 10.4* 9.1*  HCT 31.0* 25.8*    Assessment/Plan: Status post Cesarean section. Doing well postoperatively.  Continue current care.  Dorothyann Peng 03/19/2019, 7:48 AM

## 2019-03-19 NOTE — Telephone Encounter (Signed)
Left message on voicemail regarding appointment time and date for incision check and postpartum.

## 2019-03-19 NOTE — Lactation Note (Signed)
This note was copied from a baby's chart. Lactation Consultation Note:  Mother is a P3, she reports that she attempt to breastfeed other two but wasn't successful .  Mother reports that infant is breastfeeding well. She reports that infant takes entire nipple in her mouth. Mother is also supplementing with formula. Advised mother to limit formula and breastfeed infant before formula feeding.   Assist mother with hand expression and observed several drops.  Observed that mothers nipples are deep purple. She reports that this is normal color for her nipples. She denies pain with latch or any discomfort.   Mother was given a harmony hand pump with instructions. Mother comfortable with a #24 flange. Suggested that mother post pump to offer any amt of ebm.   Mother to cue base feed and feed infant 8-12 times or more in 24 hours. Mother to do frequent STS. Mother informed of cluster feeding.   Mother was given [facility] brochure and basic breastfeeding done.  Mother advised to page for staff nurse or [place] to see next feeding.   Patient Name: Girl Hannah Hamilton [id number] Date: 03/19/2019 Reason for consult: Initial assessment   Maternal Data Has patient been taught Hand Expression?: Yes Does the patient have breastfeeding experience prior to this delivery?: (only attempt with the last two children )  Feeding    LATCH Score                   Interventions Interventions: Hand express;Pre-pump if needed;Hand pump  Lactation Tools Discussed/Used Pump Review: Setup, frequency, and cleaning;Milk Storage Initiated by:: Jess Barters RN,IBCLC Date initiated:: 03/19/19   Consult Status Consult Status: Follow-up Date: 03/20/19 Follow-up type: In-patient    Jess Barters [facility] 03/19/2019, 3:12 PM

## 2019-03-20 MED ORDER — ACETAMINOPHEN 325 MG PO TABS: 650.0000 mg | ORAL_TABLET | Freq: Four times a day (QID) | ORAL | 0 refills | Status: DC | PRN

## 2019-03-20 MED ORDER — IBUPROFEN 600 MG PO TABS: 600.0000 mg | ORAL_TABLET | Freq: Four times a day (QID) | ORAL | 0 refills | Status: DC | PRN

## 2019-03-20 MED ORDER — OXYCODONE HCL 5 MG PO TABS: 5.0000 mg | ORAL_TABLET | ORAL | 0 refills | Status: DC | PRN

## 2019-03-20 NOTE — Lactation Note (Signed)
This note was copied from a baby's chart. Lactation Consultation Note:  [place] arrived in room and mother attempting to latch infant in cradle hold sitting on the side of the bed.  Assess mother with positioning infant in football hold with mother sitting up in chair. Infant latched well with some assistance flanging infant lips for wider gape.   Mother reports that her Rt nipple is very tender. Mother reports that she has coconut oil . She was advise to apply breastmilk and use comfort gels.   Encouraged to rotate positions and use good pillow support.   Advised mother to continue to cue base feed infant and at least 8-12 times or more in 24 hrs.  Discussed cluster feeding . Advised to continue to do frequent STS.  Advised in treatment and prevention of engorgement.   Encouraged mother to follow up with [facility] services at [facility] or community support.   Patient Name: Hannah Hamilton VOJJK'[id number] Date: 03/20/2019 Reason for consult: Follow-up assessment   Maternal Data    Feeding Feeding Type: Breast Fed  LATCH Score Latch: Grasps breast easily, tongue down, lips flanged, rhythmical sucking.  Audible Swallowing: Spontaneous and intermittent  Type of Nipple: Everted at rest and after stimulation  Comfort (Breast/Nipple): Filling, red/small blisters or bruises, mild/mod discomfort  Hold (Positioning): Assistance needed to correctly position infant at breast and maintain latch.  LATCH Score: 8  Interventions Interventions: Assisted with latch;Hand express;Breast compression;Adjust position;Support pillows;Position options;Hand pump  Lactation Tools Discussed/Used     Consult Status      Darla Lesches 03/20/2019, 2:02 PM

## 2019-03-20 NOTE — Discharge Instructions (Signed)
Postpartum Care After Cesarean Delivery °This sheet gives you information about how to care for yourself from the time you deliver your baby to up to 6-12 weeks after delivery (postpartum period). Your health care provider may also give you more specific instructions. If you have problems or questions, contact your health care provider. °Follow these instructions at home: °Medicines °· Take over-the-counter and prescription medicines only as told by your health care provider. °· If you were prescribed an antibiotic medicine, take it as told by your health care provider. Do not stop taking the antibiotic even if you start to feel better. °· Ask your health care provider if the medicine prescribed to you: °? Requires you to avoid driving or using heavy machinery. °? Can cause constipation. You may need to take actions to prevent or treat constipation, such as: °§ Drink enough fluid to keep your urine pale yellow. °§ Take over-the-counter or prescription medicines. °§ Eat foods that are high in fiber, such as beans, whole grains, and fresh fruits and vegetables. °§ Limit foods that are high in fat and processed sugars, such as fried or sweet foods. °Activity °· Gradually return to your normal activities as told by your health care provider. °· Avoid activities that take a lot of effort and energy (are strenuous) until approved by your health care provider. Walking at a slow to moderate pace is usually safe. Ask your health care provider what activities are safe for you. °? Do not lift anything that is heavier than your baby or 10 lb (4.5 kg) as told by your health care provider. °? Do not vacuum, climb stairs, or drive a car for as long as told by your health care provider. °· If possible, have someone help you at home until you are able to do your usual activities yourself. °· Rest as much as possible. Try to rest or take naps while your baby is sleeping. °Vaginal bleeding °· It is normal to have vaginal bleeding  (lochia) after delivery. Wear a sanitary pad to absorb vaginal bleeding and discharge. °? During the first week after delivery, the amount and appearance of lochia is often similar to a menstrual period. °? Over the next few weeks, it will gradually decrease to a dry, yellow-brown discharge. °? For most women, lochia stops completely by 4-6 weeks after delivery. Vaginal bleeding can vary from woman to woman. °· Change your sanitary pads frequently. Watch for any changes in your flow, such as: °? A sudden increase in volume. °? A change in color. °? Large blood clots. °· If you pass a blood clot, save it and call your health care provider to discuss. Do not flush blood clots down the toilet before you get instructions from your health care provider. °· Do not use tampons or douches until your health care provider says this is safe. °· If you are not breastfeeding, your period should return 6-8 weeks after delivery. If you are breastfeeding, your period may return anytime between 8 weeks after delivery and the time that you stop breastfeeding. °Perineal care ° °· If your C-section (Cesarean section) was unplanned, and you were allowed to labor and push before delivery, you may have pain, swelling, and discomfort of the tissue between your vaginal opening and your anus (perineum). You may also have an incision in the tissue (episiotomy) or the tissue may have torn during delivery. Follow these instructions as told by your health care provider: °? Keep your perineum clean and dry as told by   your health care provider. Use medicated pads and pain-relieving sprays and creams as directed. °? If you have an episiotomy or vaginal tear, check the area every day for signs of infection. Check for: °§ Redness, swelling, or pain. °§ Fluid or blood. °§ Warmth. °§ Pus or a bad smell. °? You may be given a squirt bottle to use instead of wiping to clean the perineum area after you go to the bathroom. As you start healing, you may use  the squirt bottle before wiping yourself. Make sure to wipe gently. °? To relieve pain caused by an episiotomy, vaginal tear, or hemorrhoids, try taking a warm sitz bath 2-3 times a day. A sitz bath is a warm water bath that is taken while you are sitting down. The water should only come up to your hips and should cover your buttocks. °Breast care °· Within the first few days after delivery, your breasts may feel heavy, full, and uncomfortable (breast engorgement). You may also have milk leaking from your breasts. Your health care provider can suggest ways to help relieve breast discomfort. Breast engorgement should go away within a few days. °· If you are breastfeeding: °? Wear a bra that supports your breasts and fits you well. °? Keep your nipples clean and dry. Apply creams and ointments as told by your health care provider. °? You may need to use breast pads to absorb milk leakage. °? You may have uterine contractions every time you breastfeed for several weeks after delivery. Uterine contractions help your uterus return to its normal size. °? If you have any problems with breastfeeding, work with your health care provider or a lactation consultant. °· If you are not breastfeeding: °? Avoid touching your breasts as this can make your breasts produce more milk. °? Wear a well-fitting bra and use cold packs to help with swelling. °? Do not squeeze out (express) milk. This causes you to make more milk. °Intimacy and sexuality °· Ask your health care provider when you can engage in sexual activity. This may depend on your: °? Risk of infection. °? Healing rate. °? Comfort and desire to engage in sexual activity. °· You are able to get pregnant after delivery, even if you have not had your period. If desired, talk with your health care provider about methods of family planning or birth control (contraception). °Lifestyle °· Do not use any products that contain nicotine or tobacco, such as cigarettes, e-cigarettes,  and chewing tobacco. If you need help quitting, ask your health care provider. °· Do not drink alcohol, especially if you are breastfeeding. °Eating and drinking ° °· Drink enough fluid to keep your urine pale yellow. °· Eat high-fiber foods every day. These may help prevent or relieve constipation. High-fiber foods include: °? Whole grain cereals and breads. °? Brown rice. °? Beans. °? Fresh fruits and vegetables. °· Take your prenatal vitamins until your postpartum checkup or until your health care provider tells you it is okay to stop. °General instructions °· Keep all follow-up visits for you and your baby as told by your health care provider. Most women visit their health care provider for a postpartum checkup within the first 3-6 weeks after delivery. °Contact a health care provider if you: °· Feel unable to cope with the changes that a new baby brings to your life, and these feelings do not go away. °· Feel unusually sad or worried. °· Have breasts that are painful, hard, or turn red. °· Have a fever. °·   Have trouble holding urine or keeping urine from leaking. °· Have little or no interest in activities you used to enjoy. °· Have not breastfed at all and you have not had a menstrual period for 12 weeks after delivery. °· Have stopped breastfeeding and you have not had a menstrual period for 12 weeks after you stopped breastfeeding. °· Have questions about caring for yourself or your baby. °· Pass a blood clot from your vagina. °Get help right away if you: °· Have chest pain. °· Have difficulty breathing. °· Have sudden, severe leg pain. °· Have severe pain or cramping in your abdomen. °· Bleed from your vagina so much that you fill more than one sanitary pad in one hour. Bleeding should not be heavier than your heaviest period. °· Develop a severe headache. °· Faint. °· Have blurred vision or spots in your vision. °· Have a bad-smelling vaginal discharge. °· Have thoughts about hurting yourself or your  baby. °If you ever feel like you may hurt yourself or others, or have thoughts about taking your own life, get help right away. You can go to your nearest emergency department or call: °· Your local emergency services (911 in the [place] °· A suicide crisis helpline, such as the National Suicide Prevention Lifeline at 1-800-273-8255. This is open 24 hours a day. °Summary °· The period of time from when you deliver your baby to up to 6-12 weeks after delivery is called the postpartum period. °· Gradually return to your normal activities as told by your health care provider. °· Keep all follow-up visits for you and your baby as told by your health care provider. °This information is not intended to replace advice given to you by your health care provider. Make sure you discuss any questions you have with your health care provider. °Document Released: 05/13/2000 Document Revised: 01/03/2018 Document Reviewed: 01/03/2018 °[employer]vier Patient Education © 2020 [employer] ° °

## 2019-03-28 ENCOUNTER — Encounter (HOSPITAL_COMMUNITY): Payer: Self-pay

## 2019-03-28 ENCOUNTER — Observation Stay (HOSPITAL_COMMUNITY)
Admission: EM | Admit: 2019-03-28 | Discharge: 2019-03-30 | Disposition: A | Discharge status: Home / Self Care | Payer: Medicaid Other | Attending: Obstetrics and Gynecology | Admitting: Obstetrics and Gynecology

## 2019-03-28 ENCOUNTER — Telehealth: Payer: Self-pay | Admitting: *Deleted

## 2019-03-28 ENCOUNTER — Other Ambulatory Visit: Payer: Self-pay

## 2019-03-28 DIAGNOSIS — Z23 Encounter for immunization: Secondary | ICD-10-CM | POA: Diagnosis not present

## 2019-03-28 DIAGNOSIS — O1415 Severe pre-eclampsia, complicating the puerperium: Principal | ICD-10-CM | POA: Diagnosis present

## 2019-03-28 DIAGNOSIS — O34219 Maternal care for unspecified type scar from previous cesarean delivery: Secondary | ICD-10-CM | POA: Diagnosis present

## 2019-03-28 DIAGNOSIS — O99335 Smoking (tobacco) complicating the puerperium: Secondary | ICD-10-CM | POA: Insufficient documentation

## 2019-03-28 DIAGNOSIS — F32A Depression, unspecified: Secondary | ICD-10-CM | POA: Diagnosis present

## 2019-03-28 DIAGNOSIS — O1495 Unspecified pre-eclampsia, complicating the puerperium: Secondary | ICD-10-CM | POA: Diagnosis present

## 2019-03-28 DIAGNOSIS — O99345 Other mental disorders complicating the puerperium: Secondary | ICD-10-CM | POA: Insufficient documentation

## 2019-03-28 DIAGNOSIS — F329 Major depressive disorder, single episode, unspecified: Secondary | ICD-10-CM | POA: Diagnosis present

## 2019-03-28 DIAGNOSIS — O0990 Supervision of high risk pregnancy, unspecified, unspecified trimester: Secondary | ICD-10-CM

## 2019-03-28 DIAGNOSIS — Z79899 Other long term (current) drug therapy: Secondary | ICD-10-CM | POA: Diagnosis not present

## 2019-03-28 HISTORY — DX: Severe pre-eclampsia, complicating the puerperium: O14.15

## 2019-03-28 LAB — PROTEIN / CREATININE RATIO, URINE
Creatinine, Urine: 191.17 mg/dL
Protein Creatinine Ratio: 0.06 mg/mg{Cre} (ref 0.00–0.15)
Total Protein, Urine: 12 mg/dL

## 2019-03-28 LAB — CBC
HCT: 29.1 % — ABNORMAL LOW (ref 36.0–46.0)
Hemoglobin: 9.7 g/dL — ABNORMAL LOW (ref 12.0–15.0)
MCH: 31.8 pg (ref 26.0–34.0)
MCHC: 33.3 g/dL (ref 30.0–36.0)
MCV: 95.4 fL (ref 80.0–100.0)
Platelets: 326 10*3/uL (ref 150–400)
RBC: 3.05 MIL/uL — ABNORMAL LOW (ref 3.87–5.11)
RDW: 13.1 % (ref 11.5–15.5)
WBC: 6.2 10*3/uL (ref 4.0–10.5)
nRBC: 0 % (ref 0.0–0.2)

## 2019-03-28 LAB — COMPREHENSIVE METABOLIC PANEL
ALT: 24 U/L (ref 0–44)
AST: 28 U/L (ref 15–41)
Albumin: 3.2 g/dL — ABNORMAL LOW (ref 3.5–5.0)
Alkaline Phosphatase: 84 U/L (ref 38–126)
Anion gap: 8 (ref 5–15)
BUN: 10 mg/dL (ref 6–20)
CO2: 24 mmol/L (ref 22–32)
Calcium: 8.6 mg/dL — ABNORMAL LOW (ref 8.9–10.3)
Chloride: 110 mmol/L (ref 98–111)
Creatinine, Ser: 0.76 mg/dL (ref 0.44–1.00)
GFR calc Af Amer: >60 mL/min (ref >60)
GFR calc non Af Amer: >60 mL/min (ref >60)
Glucose, Bld: 91 mg/dL (ref 70–99)
Potassium: 3.5 mmol/L (ref 3.5–5.1)
Sodium: 142 mmol/L (ref 135–145)
Total Bilirubin: 0.5 mg/dL (ref 0.3–1.2)
Total Protein: 5.9 g/dL — ABNORMAL LOW (ref 6.5–8.1)

## 2019-03-28 MED ORDER — IBUPROFEN 600 MG PO TABS: 600.0000 mg | ORAL_TABLET | Freq: Four times a day (QID) | ORAL | 1 refills | Status: DC | PRN

## 2019-03-28 MED ORDER — LABETALOL HCL 5 MG/ML IV SOLN: 40.0000 mg | INTRAVENOUS | Status: DC | PRN

## 2019-03-28 MED ORDER — HYDRALAZINE HCL 20 MG/ML IJ SOLN: 10.0000 mg | INTRAMUSCULAR | Status: DC | PRN

## 2019-03-28 MED ORDER — MAGNESIUM SULFATE 40 GM/1000ML IV SOLN
2.0000 g/h | INTRAVENOUS | Status: DC
  Administered 2019-03-29: 2 g/h via INTRAVENOUS
  Filled 2019-03-28 (×2): qty 1000

## 2019-03-28 MED ORDER — OXYCODONE-ACETAMINOPHEN 5-325 MG PO TABS
1.0000 | ORAL_TABLET | ORAL | Status: DC | PRN
  Administered 2019-03-29 – 2019-03-30 (×7): 2 via ORAL
  Filled 2019-03-28 (×7): qty 2

## 2019-03-28 MED ORDER — LABETALOL HCL 5 MG/ML IV SOLN: 80.0000 mg | INTRAVENOUS | Status: DC | PRN

## 2019-03-28 MED ORDER — PRENATAL MULTIVITAMIN CH
1.0000 | ORAL_TABLET | Freq: Every day | ORAL | Status: DC
  Administered 2019-03-29 – 2019-03-30 (×2): 1 via ORAL
  Filled 2019-03-28 (×2): qty 1

## 2019-03-28 MED ORDER — LABETALOL HCL 5 MG/ML IV SOLN: 20.0000 mg | INTRAVENOUS | Status: DC | PRN

## 2019-03-28 MED ORDER — LACTATED RINGERS IV SOLN
INTRAVENOUS | Status: DC
  Administered 2019-03-28 – 2019-03-29 (×2): via INTRAVENOUS

## 2019-03-28 MED ORDER — IBUPROFEN 600 MG PO TABS
600.0000 mg | ORAL_TABLET | Freq: Four times a day (QID) | ORAL | Status: DC | PRN
  Administered 2019-03-29 – 2019-03-30 (×4): 600 mg via ORAL
  Filled 2019-03-28 (×4): qty 1

## 2019-03-28 MED ORDER — MAGNESIUM SULFATE BOLUS VIA INFUSION
4.0000 g | Freq: Once | INTRAVENOUS | Status: AC
  Administered 2019-03-28: 4 g via INTRAVENOUS
  Filled 2019-03-28: qty 1000

## 2019-03-28 NOTE — MAU Provider Note (Signed)
Chief Complaint:  Hypertension   First Provider Initiated Contact with Patient 03/28/19 2128      HPI: Hannah Hamilton is a 38 y.o. G3P3003 who is 10 days Post Cesarean Delivery presents to maternity admissions reporting severe headache, hypertension and swelling.  Had preeclampsia last pregnancy but has been normotensive this pregnancy.  BPs on discharge were all normal   Took Tylenol for headache with no relief. . She reports vaginal bleeding, vaginal itching/burning, urinary symptoms, h/a, dizziness, n/v, or fever/chills.    Hypertension This is a new problem. The current episode started today. Associated symptoms include blurred vision, headaches and peripheral edema. Pertinent negatives include no anxiety, chest pain or shortness of breath. There are no associated agents to hypertension. Risk factors: history of previous preeclampsia. Past treatments include nothing. There are no compliance problems.     RN Note: Hannah Hamilton is a 38 y.o. 10 days postpartum post c/s in MAU reporting: high blood pressure, headache, blurred vision, and swelling in hands and feet  Onset of complaint: 2 days ago Pain score: 9 from HA   BP: 154/81  Past Medical History: Past Medical History:  Diagnosis Date  . Pregnancy induced hypertension     Past obstetric history: OB History  Gravida Para Term Preterm AB Living  3 3 3     3   SAB TAB Ectopic Multiple Live Births        0 3    # Outcome Date GA Lbr Len/2nd Weight Sex Delivery Anes PTL Lv  3 Term 03/18/19 [id number]  3415 g F CS-LTranv Spinal  LIV  2 Term 10/29/12    F CS-LTranv   LIV     Birth Comments:  pp pre eclampsia     Complications: Preeclampsia in postpartum period  1 Term 03/02/11    F CS-LTranv   LIV     Complications: Failure to Progress in First Stage    Past Surgical History: Past Surgical History:  Procedure Laterality Date  . BARTHOLIN GLAND CYST EXCISION    . CESAREAN SECTION    . CESAREAN SECTION WITH BILATERAL TUBAL LIGATION  Bilateral 03/18/2019   Procedure: CESAREAN SECTION WITH BILATERAL TUBAL LIGATION;  Surgeon: Reva BoresPratt, Tanya S, MD;  Location: [facility];  Service: Obstetrics;  Laterality: Bilateral;  . TONSILLECTOMY      Family History: Family History  Problem Relation Age of Onset  . Atrial fibrillation Mother   . Heart disease Mother     Social History: Social History   Tobacco Use  . Smoking status: Current Every Day Smoker    Packs/day: 0.50    Types: Cigarettes  . Smokeless tobacco: Never Used  Substance Use Topics  . Alcohol use: Yes    Comment: occasionally  . Drug use: No    Allergies: No Known Allergies  Meds:  Medications Prior to Admission  Medication Sig Dispense Refill Last Dose  . acetaminophen (TYLENOL) 325 MG tablet Take 2 tablets (650 mg total) by mouth every 6 (six) hours as needed for mild pain (temperature > 101.5.). 30 tablet 0 03/28/2019 at Unknown time  . albuterol (PROVENTIL HFA;VENTOLIN HFA) 108 (90 Base) MCG/ACT inhaler Inhale 1-2 puffs into the lungs every 6 (six) hours as needed for wheezing or shortness of breath. 1 Inhaler 0 Past Week at Unknown time  . albuterol (VENTOLIN HFA) 108 (90 Base) MCG/ACT inhaler Inhale 2 puffs into the lungs every 4 (four) hours as needed for wheezing or shortness of breath (cough). 18 g 0 Past  Week at Unknown time  . guaiFENesin (ROBITUSSIN) 100 MG/5ML liquid Take 200 mg by mouth 3 (three) times daily as needed for cough.   Past Week at Unknown time  . ibuprofen (ADVIL) 600 MG tablet Take 1 tablet (600 mg total) by mouth every 6 (six) hours as needed for fever or headache. 30 tablet 1 03/28/2019 at Unknown time  . oxyCODONE (OXY IR/ROXICODONE) 5 MG immediate release tablet Take 1-2 tablets (5-10 mg total) by mouth every 4 (four) hours as needed for moderate pain. 30 tablet 0 Past Week at Unknown time  . Prenatal Vit-Fe Fumarate-FA (PREPLUS) 27-1 MG TABS Take 1 tablet by mouth daily. 30 tablet 13 Past Week at Unknown time    I have  reviewed patient's Past Medical Hx, Surgical Hx, Family Hx, Social Hx, medications and allergies.  ROS:  Review of Systems  Eyes: Positive for blurred vision.  Respiratory: Negative for shortness of breath.   Cardiovascular: Negative for chest pain.  Neurological: Positive for headaches.   Other systems negative     Physical Exam   Patient Vitals for the past 24 hrs:  BP Temp Temp src Pulse Resp SpO2  03/28/19 2115 (!) 149/67 - - (!) 54 - 98 %  03/28/19 2105 (!) 154/81 98.3 F (36.8 C) Oral (!) 56 16 98 %  03/28/19 2104 - - - - - 98 %  03/28/19 2029 (!) 162/92 98.2 F (36.8 C) Oral 61 18 97 %  03/28/19 2026 (!) 162/92 - - 61 18 98 %   Constitutional: Well-developed, well-nourished female in no acute distress.  Cardiovascular: normal rate and rhythm, no ectopy audible, S1 & S2 heard, no murmur Respiratory: normal effort, no distress. Lungs CTAB with no wheezes or crackles GI: Abd soft, non-tender.  Nondistended.  No rebound, No guarding.  Bowel Sounds audible  MS: Extremities nontender, 1+ edema, normal ROM Neurologic: Alert and oriented x 4.   Grossly nonfocal.  DTRs 3+ with no clonus GU: Neg CVAT. Skin:  Warm and Dry Psych:  Affect appropriate.  PELVIC EXAM: deferred    Labs: Results for orders placed or performed during the hospital encounter of 03/28/19 (from the past 24 hour(s))  Comprehensive metabolic panel     Status: Abnormal   Collection Time: 03/28/19  9:55 PM  Result Value Ref Range   Sodium 142 135 - 145 mmol/L   Potassium 3.5 3.5 - 5.1 mmol/L   Chloride 110 98 - 111 mmol/L   CO2 24 22 - 32 mmol/L   Glucose, Bld 91 70 - 99 mg/dL   BUN 10 6 - 20 mg/dL   Creatinine, Ser 0.76 0.44 - 1.00 mg/dL   Calcium 8.6 (L) 8.9 - 10.3 mg/dL   Total Protein 5.9 (L) 6.5 - 8.1 g/dL   Albumin 3.2 (L) 3.5 - 5.0 g/dL   AST 28 15 - 41 U/L   ALT 24 0 - 44 U/L   Alkaline Phosphatase 84 38 - 126 U/L   Total Bilirubin 0.5 0.3 - 1.2 mg/dL   GFR calc non Af Amer >60 >60 mL/min    GFR calc Af Amer >60 >60 mL/min   Anion gap 8 5 - 15  CBC     Status: Abnormal   Collection Time: 03/28/19  9:55 PM  Result Value Ref Range   WBC 6.2 4.0 - 10.5 K/uL   RBC 3.05 (L) 3.87 - 5.11 MIL/uL   Hemoglobin 9.7 (L) 12.0 - 15.0 g/dL   HCT 29.1 (L) 36.0 -  46.0 %   MCV 95.4 80.0 - 100.0 fL   MCH 31.8 26.0 - 34.0 pg   MCHC 33.3 30.0 - 36.0 g/dL   RDW 29.9 37.1 - 69.6 %   Platelets 326 150 - 400 K/uL   nRBC 0.0 0.0 - 0.2 %   Protein/Creat Ratio is pending  --/--/AB POS, AB POS Performed at [facility], [address], [place], [place] [address]  [phone])  Imaging:    MAU Course/MDM: I have ordered labs as follows:  Preeclampsia labs Imaging ordered: none   Consult Dr Macon Large.  Will Order Magnesium Sulfate infusion due to severe headache and new hypertension.    Assessment: Postpartum preeclampsia with severe features  Plan: [facility] Magnesium sulfate infusion MD to follow.   Wynelle Bourgeois CNM, MSN Certified Nurse-Midwife 03/28/2019 9:28 PM

## 2019-03-28 NOTE — Telephone Encounter (Signed)
Pt called and states she has run out of her ibuprofen and her pain meds. States she is having right side pain and headaches. Pt states has had higher than her normal BP's. Denies any vision changes, SOB, right upper quadrant pain. Will have pt come in tomorrow for incision and BP check and will refill ibuprofen at this time.

## 2019-03-28 NOTE — ED Triage Notes (Signed)
Pt reports 2 days of headaches and hypertension. C section done 2 weeks ago. Pt BP 162/92 in triage. Pt a.o

## 2019-03-28 NOTE — Telephone Encounter (Signed)
Left message for pt to call us back in regards to her message left on the nurse line.

## 2019-03-28 NOTE — MAU Note (Signed)
.   Hannah Hamilton is a 38 y.o. 10 days postpartum post c/s in MAU reporting: high blood pressure, headache, blurred vision, and swelling in hands and feet  Onset of complaint: 2 days ago Pain score: 9 from HA    BP: 154/81

## 2019-03-29 ENCOUNTER — Encounter (HOSPITAL_COMMUNITY): Payer: Self-pay

## 2019-03-29 ENCOUNTER — Ambulatory Visit: Payer: Medicaid Other

## 2019-03-29 DIAGNOSIS — O1495 Unspecified pre-eclampsia, complicating the puerperium: Secondary | ICD-10-CM | POA: Diagnosis present

## 2019-03-29 DIAGNOSIS — O1415 Severe pre-eclampsia, complicating the puerperium: Secondary | ICD-10-CM | POA: Diagnosis not present

## 2019-03-29 HISTORY — DX: Unspecified pre-eclampsia, complicating the puerperium: O14.95

## 2019-03-29 MED ORDER — ENALAPRIL MALEATE 5 MG PO TABS
5.0000 mg | ORAL_TABLET | Freq: Every day | ORAL | Status: DC
  Administered 2019-03-29: 5 mg via ORAL
  Filled 2019-03-29: qty 1

## 2019-03-29 MED ORDER — PNEUMOCOCCAL VAC POLYVALENT 25 MCG/0.5ML IJ INJ
0.5000 mL | INJECTION | INTRAMUSCULAR | Status: AC
  Administered 2019-03-30: 0.5 mL via INTRAMUSCULAR
  Filled 2019-03-29 (×2): qty 0.5

## 2019-03-29 MED ORDER — ONDANSETRON 4 MG PO TBDP
8.0000 mg | ORAL_TABLET | Freq: Three times a day (TID) | ORAL | Status: DC | PRN
  Administered 2019-03-29: 20:00:00 8 mg via ORAL
  Filled 2019-03-29: qty 2

## 2019-03-29 MED ORDER — FERROUS SULFATE 325 (65 FE) MG PO TABS
325.0000 mg | ORAL_TABLET | Freq: Two times a day (BID) | ORAL | Status: DC
  Administered 2019-03-29 – 2019-03-30 (×3): 325 mg via ORAL
  Filled 2019-03-29 (×3): qty 1

## 2019-03-29 NOTE — H&P (Signed)
Chief Complaint:  Hypertension    First Provider Initiated Contact with Patient 03/28/19 2128       HPI: Hannah Hamilton is a 38 y.o. G3P3003 who is 10 days Post Cesarean Delivery presents to maternity admissions reporting severe headache, hypertension and swelling.  Had preeclampsia last pregnancy but has been normotensive this pregnancy.  BPs on discharge were all normal   Took Tylenol for headache with no relief. . She reports vaginal bleeding, vaginal itching/burning, urinary symptoms, h/a, dizziness, n/v, or fever/chills.     Hypertension This is a new problem. The current episode started today. Associated symptoms include blurred vision, headaches and peripheral edema. Pertinent negatives include no anxiety, chest pain or shortness of breath. There are no associated agents to hypertension. Risk factors: history of previous preeclampsia. Past treatments include nothing. There are no compliance problems.       RN Note: Hannah Hamilton is a 38 y.o. 10 days postpartum post c/s in MAU reporting: high blood pressure, headache, blurred vision, and swelling in hands and feet  Onset of complaint: 2 days ago Pain score: 9 from HA   BP: 154/81   Past Medical History:     Past Medical History:  Diagnosis Date  . Pregnancy induced hypertension        Past obstetric history: OB History  Gravida Para Term Preterm AB Living  3 3 3     3   SAB TAB Ectopic Multiple Live Births           0 3        # Outcome Date GA Lbr Len/2nd Weight Sex Delivery Anes PTL Lv  3 Term 03/18/19 [redacted]w[redacted]d   3415 g F CS-LTranv Spinal   LIV  2 Term 10/29/12       F CS-LTranv     LIV     Birth Comments:  pp pre eclampsia     Complications: Preeclampsia in postpartum period  1 Term 03/02/11       F CS-LTranv     LIV     Complications: Failure to Progress in First Stage      Past Surgical History:      Past Surgical History:  Procedure Laterality Date  . BARTHOLIN GLAND CYST EXCISION      . CESAREAN SECTION      .  CESAREAN SECTION WITH BILATERAL TUBAL LIGATION Bilateral 03/18/2019    Procedure: CESAREAN SECTION WITH BILATERAL TUBAL LIGATION;  Surgeon: Donnamae Jude, MD;  Location: [facility];  Service: Obstetrics;  Laterality: Bilateral;  . TONSILLECTOMY          Family History:      Family History  Problem Relation Age of Onset  . Atrial fibrillation Mother    . Heart disease Mother        Social History: Social History         Tobacco Use  . Smoking status: Current Every Day Smoker      Packs/day: 0.50      Types: Cigarettes  . Smokeless tobacco: Never Used  Substance Use Topics  . Alcohol use: Yes      Comment: occasionally  . Drug use: No      Allergies: No Known Allergies   Meds:         Medications Prior to Admission  Medication Sig Dispense Refill Last Dose  . acetaminophen (TYLENOL) 325 MG tablet Take 2 tablets (650 mg total) by mouth every 6 (six) hours as needed for mild pain (temperature >  101.5.). 30 tablet 0 03/28/2019 at Unknown time  . albuterol (PROVENTIL HFA;VENTOLIN HFA) 108 (90 Base) MCG/ACT inhaler Inhale 1-2 puffs into the lungs every 6 (six) hours as needed for wheezing or shortness of breath. 1 Inhaler 0 Past Week at Unknown time  . albuterol (VENTOLIN HFA) 108 (90 Base) MCG/ACT inhaler Inhale 2 puffs into the lungs every 4 (four) hours as needed for wheezing or shortness of breath (cough). 18 g 0 Past Week at Unknown time  . guaiFENesin (ROBITUSSIN) 100 MG/5ML liquid Take 200 mg by mouth 3 (three) times daily as needed for cough.     Past Week at Unknown time  . ibuprofen (ADVIL) 600 MG tablet Take 1 tablet (600 mg total) by mouth every 6 (six) hours as needed for fever or headache. 30 tablet 1 03/28/2019 at Unknown time  . oxyCODONE (OXY IR/ROXICODONE) 5 MG immediate release tablet Take 1-2 tablets (5-10 mg total) by mouth every 4 (four) hours as needed for moderate pain. 30 tablet 0 Past Week at Unknown time  . Prenatal Vit-Fe Fumarate-FA (PREPLUS) 27-1 MG TABS  Take 1 tablet by mouth daily. 30 tablet 13 Past Week at Unknown time      I have reviewed patient's Past Medical Hx, Surgical Hx, Family Hx, Social Hx, medications and allergies.   ROS:  Review of Systems  Eyes: Positive for blurred vision.  Respiratory: Negative for shortness of breath.   Cardiovascular: Negative for chest pain.  Neurological: Positive for headaches.    Other systems negative       Physical Exam    Patient Vitals for the past 24 hrs:   BP Temp Temp src Pulse Resp SpO2  03/28/19 2115 (!) 149/67 - - (!) 54 - 98 %  03/28/19 2105 (!) 154/81 98.3 F (36.8 C) Oral (!) 56 16 98 %  03/28/19 2104 - - - - - 98 %  03/28/19 2029 (!) 162/92 98.2 F (36.8 C) Oral 61 18 97 %  03/28/19 2026 (!) 162/92 - - 61 18 98 %    Constitutional: Well-developed, well-nourished female in no acute distress.  Cardiovascular: normal rate and rhythm, no ectopy audible, S1 & S2 heard, no murmur Respiratory: normal effort, no distress. Lungs CTAB with no wheezes or crackles GI: Abd soft, non-tender.  Nondistended.  No rebound, No guarding.  Bowel Sounds audible  MS: Extremities nontender, 1+ edema, normal ROM Neurologic: Alert and oriented x 4.   Grossly nonfocal.  DTRs 3+ with no clonus GU: Neg CVAT. Skin:  Warm and Dry Psych:  Affect appropriate.   PELVIC EXAM: deferred     Labs: Lab Results Last 24 Hours       Results for orders placed or performed during the hospital encounter of 03/28/19 (from the past 24 hour(s))  Comprehensive metabolic panel     Status: Abnormal    Collection Time: 03/28/19  9:55 PM  Result Value Ref Range    Sodium 142 135 - 145 mmol/L    Potassium 3.5 3.5 - 5.1 mmol/L    Chloride 110 98 - 111 mmol/L    CO2 24 22 - 32 mmol/L    Glucose, Bld 91 70 - 99 mg/dL    BUN 10 6 - 20 mg/dL    Creatinine, Ser 1.610.76 0.44 - 1.00 mg/dL    Calcium 8.6 (L) 8.9 - 10.3 mg/dL    Total Protein 5.9 (L) 6.5 - 8.1 g/dL    Albumin 3.2 (L) 3.5 - 5.0 g/dL  AST 28 15 - 41 U/L     ALT 24 0 - 44 U/L    Alkaline Phosphatase 84 38 - 126 U/L    Total Bilirubin 0.5 0.3 - 1.2 mg/dL    GFR calc non Af Amer >60 >60 mL/min    GFR calc Af Amer >60 >60 mL/min    Anion gap 8 5 - 15  CBC     Status: Abnormal    Collection Time: 03/28/19  9:55 PM  Result Value Ref Range    WBC 6.2 4.0 - 10.5 K/uL    RBC 3.05 (L) 3.87 - 5.11 MIL/uL    Hemoglobin 9.7 (L) 12.0 - 15.0 g/dL    HCT 67.5 (L) 91.6 - 46.0 %    MCV 95.4 80.0 - 100.0 fL    MCH 31.8 26.0 - 34.0 pg    MCHC 33.3 30.0 - 36.0 g/dL    RDW 38.4 66.5 - 99.3 %    Platelets 326 150 - 400 K/uL    nRBC 0.0 0.0 - 0.2 %     Protein/Creat Ratio is pending   --/--/AB POS, AB POS Performed at [facility], [address] [place], [place] [address]  [phone])   Imaging:      MAU Course/MDM: I have ordered labs as follows:  Preeclampsia labs Imaging ordered: none   Consult Dr Macon Large.  Will Order Magnesium Sulfate infusion due to severe headache and new hypertension.      Assessment: Postpartum preeclampsia with severe features   Plan: Ad[facility] to [facility] Magnesium sulfate infusion for at least 24 hours. Antihypertensives as needed. MD to follow.    Wynelle Bourgeois CNM, MSN Certified Nurse-Midwife 03/28/2019  Attestation of Attending Supervision of Advanced Practice Provider (PA/CNM/NP): Evaluation and management procedures were performed by the Advanced Practice Provider under my supervision and collaboration.  I have reviewed the Advanced Practice Provider's note and chart, and I agree with the management and plan.  Patient was seen and evaluated. Reiterated the plan as delineated above.   Jaynie Collins, MD, FACOG Attending Obstetrician & Gynecologist, [facility] for [facility], [facility]    9:28 PM

## 2019-03-29 NOTE — Progress Notes (Signed)
Faculty Practice OB/GYN Attending Note  Subjective:  Reports headache as moderate in intensity, has taken some analgesia. Patient denies  visual symptoms, RUQ/epigastric pain or other concerning symptoms.  Admitted on 03/28/2019 for Severe pre-eclampsia, postpartum condition or complication.    Objective:  Blood pressure 130/77, pulse (!) 59, temperature (!) 97.5 F (36.4 C), temperature source Oral, resp. rate 19, height 5\' 4"  (1.626 m), weight 86.2 kg, SpO2 98 %, unknown if currently breastfeeding. Patient Vitals for the past 24 hrs:  BP Temp Temp src Pulse Resp SpO2 Height Weight  03/29/19 0601 130/77 - - (!) 59 19 - - -  03/29/19 0501 (!) 141/86 - - 71 17 98 % - 86.2 kg  03/29/19 0401 116/61 - - 70 19 - - -  03/29/19 0301 138/85 - - (!) 56 18 - - -  03/29/19 0201 138/75 - - (!) 50 17 - - -  03/29/19 0120 - - - - - - - 89.7 kg  03/29/19 0100 (!) 148/80 (!) 97.5 F (36.4 C) Oral (!) 54 18 99 % 5\' 4"  (1.626 m) -  03/29/19 0010 (!) 144/71 97.9 F (36.6 C) Oral (!) 55 19 98 % - -  03/29/19 0000 133/74 - - (!) 53 17 98 % - -  03/28/19 2350 (!) 152/73 - - (!) 50 16 - - -  03/28/19 2246 (!) 149/92 - - 75 - - - -  03/28/19 2231 137/84 - - (!) 51 - - - -  03/28/19 2216 (!) 154/84 - - (!) 53 - - - -  03/28/19 2201 (!) 150/75 - - (!) 51 - - - -  03/28/19 2146 (!) 143/82 - - (!) 58 - - - -  03/28/19 2137 125/68 - - (!) 57 - - - -  03/28/19 2115 (!) 149/67 - - (!) 54 - 98 % - -  03/28/19 2105 (!) 154/81 98.3 F (36.8 C) Oral (!) 56 16 98 % - -  03/28/19 2104 - - - - - 98 % - -  03/28/19 2029 (!) 162/92 98.2 F (36.8 C) Oral 61 18 97 % - -  03/28/19 2026 (!) 162/92 - - 61 18 98 % - -    Gen: NAD HENT: Normocephalic, atraumatic Lungs: Normal respiratory effort Heart: Regular rate noted Abdomen: NT gravid fundus, soft Cervix: Deferred Ext: 2+ DTRs, no edema, no cyanosis, negative Homan's sign  Labs: CMP Latest Ref Rng & Units 03/28/2019 03/18/2019 01/04/2018  Glucose 70 - 99 mg/dL  91 - 95  BUN 6 - 20 mg/dL 10 - 8  Creatinine 0.44 - 1.00 mg/dL 0.76 0.52 0.86  Sodium 135 - 145 mmol/L 142 - 134(L)  Potassium 3.5 - 5.1 mmol/L 3.5 - 3.7  Chloride 98 - 111 mmol/L 110 - 102  CO2 22 - 32 mmol/L 24 - 19(L)  Calcium 8.9 - 10.3 mg/dL 8.6(L) - 9.2  Total Protein 6.5 - 8.1 g/dL 5.9(L) - -  Total Bilirubin 0.3 - 1.2 mg/dL 0.5 - -  Alkaline Phos 38 - 126 U/L 84 - -  AST 15 - 41 U/L 28 - -  ALT 0 - 44 U/L 24 - -   CBC Latest Ref Rng & Units 03/28/2019 03/19/2019 03/18/2019  WBC 4.0 - 10.5 K/uL 6.2 16.4(H) 18.1(H)  Hemoglobin 12.0 - 15.0 g/dL 9.7(L) 9.1(L) 10.4(L)  Hematocrit 36.0 - 46.0 % 29.1(L) 25.8(L) 31.0(L)  Platelets 150 - 400 K/uL 326 185 210    Assessment & Plan:  38 y.o.  [id number] admitted for postpartum severe preeclampsia - Continue Magnesium sulfate for at least 24 hours - Started on Enalapril 5 mg daily, can titrate up as needed - Analgesia as needed. - Ferrous sulfate ordered for postpartum anemia Continue close observation.  Jaynie Collins, MD, FACOG Obstetrician & Gynecologist, [facility] for [employer], [facility]

## 2019-03-30 DIAGNOSIS — F329 Major depressive disorder, single episode, unspecified: Secondary | ICD-10-CM | POA: Diagnosis present

## 2019-03-30 DIAGNOSIS — F32A Depression, unspecified: Secondary | ICD-10-CM | POA: Diagnosis present

## 2019-03-30 DIAGNOSIS — O1415 Severe pre-eclampsia, complicating the puerperium: Secondary | ICD-10-CM | POA: Diagnosis not present

## 2019-03-30 HISTORY — DX: Depression, unspecified: F32.A

## 2019-03-30 MED ORDER — FERROUS SULFATE 325 (65 FE) MG PO TABS: 325.0000 mg | ORAL_TABLET | Freq: Two times a day (BID) | ORAL | 2 refills | Status: DC

## 2019-03-30 MED ORDER — ENALAPRIL MALEATE 10 MG PO TABS: 10.0000 mg | ORAL_TABLET | Freq: Every day | ORAL | 2 refills | Status: DC

## 2019-03-30 MED ORDER — ENALAPRIL MALEATE 5 MG PO TABS
10.0000 mg | ORAL_TABLET | Freq: Every day | ORAL | Status: DC
  Administered 2019-03-30: 10 mg via ORAL
  Filled 2019-03-30: qty 2

## 2019-03-30 MED ORDER — OXYCODONE HCL 5 MG PO TABS: 10.0000 mg | ORAL_TABLET | ORAL | Status: DC | PRN

## 2019-03-30 MED ORDER — SERTRALINE HCL 50 MG PO TABS: 50.0000 mg | ORAL_TABLET | Freq: Every day | ORAL | 2 refills | Status: DC

## 2019-03-30 MED ORDER — OXYCODONE HCL 5 MG PO TABS
5.0000 mg | ORAL_TABLET | ORAL | Status: DC | PRN
  Administered 2019-03-30: 5 mg via ORAL
  Filled 2019-03-30: qty 1

## 2019-03-30 MED ORDER — METOCLOPRAMIDE HCL 10 MG PO TABS: 5.0000 mg | ORAL_TABLET | Freq: Four times a day (QID) | ORAL | Status: DC | PRN

## 2019-03-30 NOTE — Discharge Summary (Signed)
Postpartum Discharge Summary     Patient Name: Hannah Hamilton DOB: 1981/03/21 MRN: [id number]  Date of admission: 03/28/2019 Delivering Provider: Reva Bores   Date of discharge: 03/30/2019  Admitting diagnosis: HBP, light-headed, swelling, PP   Secondary diagnosis:  Principal Problem:   Severe preeclampsia, postpartum condition Active Problems:   Supervision of high risk pregnancy, antepartum   Previous cesarean delivery, antepartum   Preeclampsia in postpartum period   Depression  Additional problems: depression     Discharge diagnosis: Preeclampsia (severe)                                                                                                Post partum procedures:magnesium  Hospital course:   Patient readmitted POD#10 for headache, hypertension with severe pre-eclampsia. She was given 24 hrs magnesium and started on an anti-hypertensive. She had a severe headache that improved with caffeine. Her course was otherwise uncomplicated. She was discharged to home HD#2 in stable condition. She will have a BP check early next week. Was sent home on anti-hypertensive. Patient requested to start an anti-depressant, I reviewed the risks/benefits and she is agreeable to starting zoloft, denies any thoughts of SI/HI, knows to call with any worsening symptoms. Agreeable to seeing [facility] at [facility], message sent to staff to get her set up with appointment. Discharge instructions given.    Physical exam  Vitals:   03/29/19 1800 03/29/19 2014 03/30/19 0024 03/30/19 0354  BP:  (!) 147/82 136/76 (!) 148/77  Pulse:  66 68 62  Resp: 17 17 16 18   Temp:  98 F (36.7 C) 97.9 F (36.6 C) 98.3 F (36.8 C)  TempSrc:  Oral Oral Oral  SpO2:  98% 100% 99%  Weight:      Height:       General: alert, cooperative and no distress Lochia: appropriate Uterine Fundus: firm Incision: Healing well with no significant drainage DVT Evaluation: No evidence of DVT seen on physical exam. Labs: Lab  Results  Component Value Date   WBC 6.2 03/28/2019   HGB 9.7 (L) 03/28/2019   HCT 29.1 (L) 03/28/2019   MCV 95.4 03/28/2019   PLT 326 03/28/2019   CMP Latest Ref Rng & Units 03/28/2019  Glucose 70 - 99 mg/dL 91  BUN 6 - 20 mg/dL 10  Creatinine 03/30/2019 - 2.77 mg/dL 4.12  Sodium 8.78 - 676 mmol/L 142  Potassium 3.5 - 5.1 mmol/L 3.5  Chloride 98 - 111 mmol/L 110  CO2 22 - 32 mmol/L 24  Calcium 8.9 - 10.3 mg/dL 720)  Total Protein 6.5 - 8.1 g/dL 5.9(L)  Total Bilirubin 0.3 - 1.2 mg/dL 0.5  Alkaline Phos 38 - 126 U/L 84  AST 15 - 41 U/L 28  ALT 0 - 44 U/L 24    Discharge instruction: per After Visit Summary and "Baby and Me Booklet".  After visit meds:  Allergies as of 03/30/2019   No Known Allergies     Medication List    TAKE these medications   acetaminophen 325 MG tablet Commonly known as: TYLENOL Take 2 tablets (650 mg total) by  mouth every 6 (six) hours as needed for mild pain (temperature > 101.5.).   albuterol 108 (90 Base) MCG/ACT inhaler Commonly known as: VENTOLIN HFA Inhale 1-2 puffs into the lungs every 6 (six) hours as needed for wheezing or shortness of breath.   albuterol 108 (90 Base) MCG/ACT inhaler Commonly known as: VENTOLIN HFA Inhale 2 puffs into the lungs every 4 (four) hours as needed for wheezing or shortness of breath (cough).   enalapril 10 MG tablet Commonly known as: VASOTEC Take 1 tablet (10 mg total) by mouth daily.   ferrous sulfate 325 (65 FE) MG tablet Take 1 tablet (325 mg total) by mouth 2 (two) times daily with a meal.   guaiFENesin 100 MG/5ML liquid Commonly known as: ROBITUSSIN Take 200 mg by mouth 3 (three) times daily as needed for cough.   ibuprofen 600 MG tablet Commonly known as: ADVIL Take 1 tablet (600 mg total) by mouth every 6 (six) hours as needed for fever or headache.   oxyCODONE 5 MG immediate release tablet Commonly known as: Oxy IR/ROXICODONE Take 1-2 tablets (5-10 mg total) by mouth every 4 (four) hours as  needed for moderate pain.   PrePLUS 27-1 MG Tabs Take 1 tablet by mouth daily.   sertraline 50 MG tablet Commonly known as: Zoloft Take 1 tablet (50 mg total) by mouth daily. Take 1/2 tablet (25mg ) daily by mouth for one week, then take 1 tablet daily by mouth       Diet: routine diet  Activity: Advance as tolerated. Pelvic rest for 6 weeks.   Outpatient follow up:2 weeks Follow up Appt: Future Appointments  Date Time Provider [place]  04/16/2019  2:15 PM Emily Filbert, MD [facility]   Follow up Visit: [place] [facility]. Schedule an appointment as soon as possible for a visit.   Specialty: Obstetrics and Gynecology Contact information: [address], [place] [id number] [address]  [place] [phone]           03/30/2019 Sloan Leiter, MD

## 2019-03-30 NOTE — Discharge Instructions (Signed)
HOW TO TAKE YOUR BLOOD PRESSURE AT HOME  Take your blood pressure at home. Relax quietly for about 15 minutes then take your blood pressure. DO NOT take it when you are upset, stressed, or have just been active. Make sure your blood pressure cuff fits appropriately, there should be measurements on the box and on the cuff to help guide you.   Once you have taken your blood pressure, look at the numbers. If the top number (systolic) is equal to or above 140, please call the office and let us know. If the bottom number (diastolic) is equal to or above 90, please call the office and let us know.   If you have any questions or are concerned you are not taking your blood pressure correctly, please call the office!  If you have a headache that does not improve, problems with your vision, abdominal pain or other concerns, please call and let us know. Please go to the hospital with any severe symptoms. Please go to the hospital for labor, painful contractions every 5 minutes or less, leaking of fluid, or if you have not felt normal fetal movement.

## 2019-03-30 NOTE — Progress Notes (Signed)
CSW received consult due to score 14 on Edinburgh Depression Screen.    CSW met with MOB in room 106. When CSW arrived MOB was in bed drinking coffee.  MOB appeared happy and dressed for discharge.  MOB was polite, easy to engage, and receptive to meeting with CSW.  CSW asked about MOB's thoughts and feeling as it related to MOB's readmission.  MOB tearfully talked about missing her family but especially missing her new born. CSW validated and normalized MOB's thoughts and feelings. CSW reviewed MOB's Edinburgh results and MOB attributed her high score to being readmitted and being away from her family.   CSW provided education regarding Baby Blues vs PMADs and provided MOB with resources for mental health follow up.  CSW encouraged MOB to evaluate her mental health throughout the postpartum period with the use of the New Mom Checklist developed by Postpartum Progress as well as the [place] Postnatal Depression Scale and notify a medical professional if symptoms arise. MOB presented with insight and awareness and did not demonstrate any acute symptoms. MOB was receptive to resources to outpatient counseling and reported that MOB's OB provider was also making a referral.  MOB shared that she has a great support team that consist of MOB's and FOB's immediate family.  CSW assessed SI, HI, and DV; MOB denied them all.   There are no barriers to discharge.   Laurey Arrow, MSW, LCSW Clinical Social Work [phone]

## 2019-04-01 ENCOUNTER — Ambulatory Visit: Payer: Medicaid Other

## 2019-04-03 ENCOUNTER — Ambulatory Visit (INDEPENDENT_AMBULATORY_CARE_PROVIDER_SITE_OTHER): Payer: Medicaid Other | Admitting: Clinical

## 2019-04-03 ENCOUNTER — Ambulatory Visit (INDEPENDENT_AMBULATORY_CARE_PROVIDER_SITE_OTHER): Payer: Medicaid Other | Admitting: Emergency Medicine

## 2019-04-03 ENCOUNTER — Other Ambulatory Visit: Payer: Self-pay

## 2019-04-03 ENCOUNTER — Ambulatory Visit: Payer: Medicaid Other

## 2019-04-03 VITALS — BP 162/97 | HR 78 | Temp 97.9°F | Wt 191.6 lb

## 2019-04-03 DIAGNOSIS — F4321 Adjustment disorder with depressed mood: Secondary | ICD-10-CM

## 2019-04-03 DIAGNOSIS — Z0130 Encounter for examination of blood pressure without abnormal findings: Secondary | ICD-10-CM

## 2019-04-03 MED ORDER — ENALAPRIL MALEATE 20 MG PO TABS: 20.0000 mg | ORAL_TABLET | Freq: Every day | ORAL | 3 refills | Status: DC

## 2019-04-03 NOTE — Progress Notes (Signed)
ATTESTATION OF SUPERVISION OF RN: Evaluation and management procedures were performed by the RN under my supervision and collaboration. I have reviewed the nursing note and chart and agree with the management and plan for this patient.  Prescription adjustment reviewed with Dr. Kennon Rounds. Patient to return to clinic in one week for blood pressure check.  Mallie Snooks, CNM

## 2019-04-03 NOTE — BH Specialist Note (Signed)
Integrated Behavioral Health Initial Visit  MRN: [id number] Name: Hannah Hamilton  Number of [facility] Clinician visits:: 1/6 Session Start time: 11:10  Session End time: 11:30 Total time: 20  Type of Service: [place] Interpretor:No. Interpretor Name and Language: n/a   Warm Hand Off Completed.       SUBJECTIVE: Hannah Hamilton is a 38 y.o. female accompanied by n/a Patient was referred by Vivien Rota, MD for depression. Patient reports the following symptoms/concerns: Pt states her primary concern today is grieving the loss of her brother(most recent), father, step mother's mother, step brother, mother-in-law, most in the month of November; feels she is coping really well after her daughter was born, and has come to term with all her losses.  Duration of problem: Ongoing for several years with numerous losses; Severity of problem: mild  OBJECTIVE: Mood: Normal and Affect: Appropriate Risk of harm to self or others: No plan to harm self or others  LIFE CONTEXT: Family and Social: Pt lives with her boyfriend and daughters School/Work: - Self-Care: Spirituality as healing Life Changes: Recent childbirth; numerous family losses  GOALS ADDRESSED: Patient will: 1. Demonstrate ability to: Continue healthy grieving over loss  INTERVENTIONS: Interventions utilized: Supportive Counseling  Standardized Assessments completed: GAD-7 and PHQ 9  ASSESSMENT: Patient currently experiencing Grief.   Patient may benefit from psychoeducation and brief therapeutic interventions regarding coping with symptoms of depression.  PLAN: 1. Follow up with behavioral health clinician on : As needed 2. Behavioral recommendations:  -Continue allowing self to grieve; continue using spirituality as healing 3. Referral(s): [facility] (In Clinic) 4. "From scale of 1-10, how likely are you to follow plan?": [place], LCSW

## 2019-04-03 NOTE — Progress Notes (Signed)
Pt here today for blood pressure check. First BP 162/97 and second BP 149/96. Pt denies headaches or visual changes. Pt currently taking 10 mg Vasotec daily and reports taking last dose this morning at 9am. Per Aldona Bar, CNM orders placed for pt to start taking 20 mg vasotec daily. Pt to follow up with another blood pressure check next week. Pt verbalized understanding and had no further questions.   Loma Sousa, RN 04/03/2019   [phone]

## 2019-04-10 ENCOUNTER — Ambulatory Visit: Payer: Medicaid Other

## 2019-04-11 ENCOUNTER — Ambulatory Visit (INDEPENDENT_AMBULATORY_CARE_PROVIDER_SITE_OTHER): Payer: Medicaid Other | Admitting: Lactation Services

## 2019-04-11 ENCOUNTER — Other Ambulatory Visit: Payer: Self-pay

## 2019-04-11 DIAGNOSIS — Z0130 Encounter for examination of blood pressure without abnormal findings: Secondary | ICD-10-CM

## 2019-04-11 NOTE — Progress Notes (Signed)
Pt here for BP check PP. Pt has been readmitted at 10 days PP for elevated BP.   Pt reports intermittent HA with no blurred vision or dizziness. She reports her HA have decreased. BP 150/83 LA, Repeat 155/80 HR 55.   Discussed with Dr. Roselie Awkward. Pt is to stay on the dosage she is on and to be re-evaluted by Dr. Hulan Fray on 11/17 at her PP visit.   Pt reports pain to lower left sided pain near incision sight with some nausea in the last few days. She feels better today. Pt feels like there is a knot on the left side of her incision. Incision well approximated without redness or drainage.   Pt is planning to have PP visit at [facility] and would like to transfer to this office due to home location.   Pt aware to go to ED if her symptoms worsen between appointments.

## 2019-04-16 ENCOUNTER — Encounter: Payer: Self-pay | Admitting: Radiology

## 2019-04-16 ENCOUNTER — Other Ambulatory Visit: Payer: Self-pay

## 2019-04-16 ENCOUNTER — Encounter: Payer: Self-pay | Admitting: Obstetrics & Gynecology

## 2019-04-16 ENCOUNTER — Ambulatory Visit (INDEPENDENT_AMBULATORY_CARE_PROVIDER_SITE_OTHER): Payer: Medicaid Other | Admitting: Obstetrics & Gynecology

## 2019-04-16 VITALS — BP 137/82 | HR 93 | Wt 187.4 lb

## 2019-04-16 DIAGNOSIS — Z1389 Encounter for screening for other disorder: Secondary | ICD-10-CM | POA: Diagnosis not present

## 2019-04-16 DIAGNOSIS — O1495 Unspecified pre-eclampsia, complicating the puerperium: Secondary | ICD-10-CM

## 2019-04-16 NOTE — Progress Notes (Signed)
Post Partum Exam  Hannah Hamilton is a 38 y.o. G51P3003 female who presents for a postpartum visit. She is 4  weeks postpartum following a low cervical transverse Cesarean section. I have fully reviewed the prenatal and intrapartum course. The delivery was at 39.0 gestational weeks.  Anesthesia: spinal. Postpartum course has been complicated by postpartum preeclampisa.. Baby's course has been uncomplicated. Baby is feeding by bottle - enfamil. Bleeding . Bowel function is normal. Bladder function is normal. Patient is not sexually active. Contraception method is tubal ligation. Postpartum depression screening:neg  The following portions of the patient's history were reviewed and updated as appropriate: allergies, current medications, past family history, past medical history, past social history, past surgical history and problem list. Last pap smear done 09/12/2018  and was Normal  Review of Systems Pertinent items are noted in HPI.   Pap 4/20 and normal.  Objective:  unknown if currently breastfeeding.  General:  alert   Breasts:  inspection negative, no nipple discharge or bleeding, no masses or nodularity palpable  Lungs: clear to auscultation bilaterally  Heart:  regular rate and rhythm, S1, S2 normal, no murmur, click, rub or gallop  Abdomen: soft, non-tender; bowel sounds normal; no masses,  no organomegaly, incision- healed beautifully   Vulva:  not evaluated  Vagina: not evaluated  Cervix:  not evaluated  Corpus: not examined  Adnexa:  not evaluated  Rectal Exam: Not performed.        Assessment:    Normal postpartum exam. Pap smear not done at today's visit.   Plan:   1. Contraception: tubal ligation 2. Continue weight loss 3. Refer to fam med to manage her HTN

## 2019-04-22 ENCOUNTER — Other Ambulatory Visit: Payer: Self-pay | Admitting: Obstetrics and Gynecology

## 2019-06-20 ENCOUNTER — Other Ambulatory Visit: Payer: Self-pay | Admitting: Obstetrics and Gynecology

## 2019-09-23 DIAGNOSIS — U071 COVID-19: Secondary | ICD-10-CM | POA: Diagnosis not present

## 2019-09-23 DIAGNOSIS — R05 Cough: Secondary | ICD-10-CM | POA: Diagnosis not present

## 2020-05-26 ENCOUNTER — Other Ambulatory Visit: Payer: Self-pay

## 2020-05-26 ENCOUNTER — Ambulatory Visit (HOSPITAL_COMMUNITY): Admit: 2020-05-26 | Discharge status: Absent without leave | Payer: Medicaid Other

## 2020-05-26 ENCOUNTER — Ambulatory Visit (HOSPITAL_COMMUNITY)
Admission: EM | Admit: 2020-05-26 | Discharge: 2020-05-26 | Disposition: A | Discharge status: Home / Self Care | Payer: Medicaid Other | Attending: Family Medicine | Admitting: Family Medicine

## 2020-05-26 ENCOUNTER — Encounter (HOSPITAL_COMMUNITY): Payer: Self-pay

## 2020-05-26 DIAGNOSIS — B029 Zoster without complications: Secondary | ICD-10-CM

## 2020-05-26 MED ORDER — VALACYCLOVIR HCL 1 G PO TABS: 1000.0000 mg | ORAL_TABLET | Freq: Three times a day (TID) | ORAL | 0 refills | Status: DC

## 2020-05-26 MED ORDER — IBUPROFEN 800 MG PO TABS: 800.0000 mg | ORAL_TABLET | Freq: Three times a day (TID) | ORAL | 0 refills | Status: DC

## 2020-05-26 NOTE — ED Provider Notes (Signed)
  [facility]   [id number] 05/26/20 Arrival Time: 1146  ASSESSMENT & PLAN:  1. Herpes zoster without complication    See AVS for d/c information given.  Begin: Meds ordered this encounter  Medications  . ibuprofen (ADVIL) 800 MG tablet    Sig: Take 1 tablet (800 mg total) by mouth 3 (three) times daily with meals.    Dispense:  21 tablet    Refill:  0  . valACYclovir (VALTREX) 1000 MG tablet    Sig: Take 1 tablet (1,000 mg total) by mouth 3 (three) times daily.    Dispense:  21 tablet    Refill:  0     Will follow up with PCP or here if worsening or failing to improve as anticipated. Reviewed expectations re: course of current medical issues. Questions answered. Outlined signs and symptoms indicating need for more acute intervention. Patient verbalized understanding. After Visit Summary given.   SUBJECTIVE:  Hannah Hamilton is a 39 y.o. female who presents with a skin complaint. Painful rash; R neck toward shoulder; first noted 4-5 d ago. Neck soreness causing HA. Afebrile. No n/v/d. No h/o similar. No extremity sensation changes or weakness.   OBJECTIVE: Vitals:   05/26/20 1334  BP: 128/70  Pulse: 60  Resp: 18  Temp: 98.4 F (36.9 C)  TempSrc: Oral  SpO2: 100%    General appearance: alert; no distress HEENT: [place]; AT Neck: supple with FROM; small R cervical LAD Lungs: unlabored Extremities: no edema; moves all extremities normally Skin: warm and dry; signs of infection: no; crops of red/purplish papules over R neck toward R shoulder Psychological: alert and cooperative; normal mood and affect  No Known Allergies  Past Medical History:  Diagnosis Date  . Pregnancy induced hypertension    Social History   Socioeconomic History  . Marital status: Single    Spouse name: Not on file  . Number of children: Not on file  . Years of education: Not on file  . Highest education level: Not on file  Occupational History  . Not on file  Tobacco Use  .  Smoking status: Current Every Day Smoker    Packs/day: 0.50    Types: Cigarettes  . Smokeless tobacco: Never Used  Vaping Use  . Vaping Use: Never used  Substance and Sexual Activity  . Alcohol use: Yes    Comment: occasionally  . Drug use: No  . Sexual activity: Yes    Birth control/protection: None  Other Topics Concern  . Not on file  Social History Narrative  . Not on file   Social Determinants of Health   Financial Resource Strain: Not on file  Food Insecurity: Not on file  Transportation Needs: Not on file  Physical Activity: Not on file  Stress: Not on file  Social Connections: Not on file  Intimate Partner Violence: Not on file   Family History  Problem Relation Age of Onset  . Atrial fibrillation Mother   . Heart disease Mother    Past Surgical History:  Procedure Laterality Date  . BARTHOLIN GLAND CYST EXCISION    . CESAREAN SECTION    . CESAREAN SECTION WITH BILATERAL TUBAL LIGATION Bilateral 03/18/2019   Procedure: CESAREAN SECTION WITH BILATERAL TUBAL LIGATION;  Surgeon: Reva Bores, MD;  Location: [facility] LD [facility];  Service: Obstetrics;  Laterality: Bilateral;  . Greig Right, MD 05/26/20 1446

## 2020-05-26 NOTE — ED Triage Notes (Signed)
Pt presents with rash and pain on right shoulder & neck X 5 days that is causing headache.

## 2020-12-17 DIAGNOSIS — S60221A Contusion of right hand, initial encounter: Secondary | ICD-10-CM | POA: Diagnosis not present

## 2021-03-23 DIAGNOSIS — L02211 Cutaneous abscess of abdominal wall: Secondary | ICD-10-CM | POA: Diagnosis not present

## 2021-03-23 DIAGNOSIS — J40 Bronchitis, not specified as acute or chronic: Secondary | ICD-10-CM | POA: Diagnosis not present

## 2021-06-07 ENCOUNTER — Ambulatory Visit
Admission: EM | Admit: 2021-06-07 | Discharge: 2021-06-07 | Disposition: A | Discharge status: Home / Self Care | Payer: Medicaid Other | Attending: Internal Medicine | Admitting: Internal Medicine

## 2021-06-07 ENCOUNTER — Encounter: Payer: Self-pay | Admitting: Emergency Medicine

## 2021-06-07 ENCOUNTER — Other Ambulatory Visit: Payer: Self-pay

## 2021-06-07 DIAGNOSIS — K0889 Other specified disorders of teeth and supporting structures: Secondary | ICD-10-CM

## 2021-06-07 DIAGNOSIS — K047 Periapical abscess without sinus: Secondary | ICD-10-CM | POA: Diagnosis not present

## 2021-06-07 MED ORDER — LIDOCAINE VISCOUS HCL 2 % MT SOLN: 15.0000 mL | OROMUCOSAL | 0 refills | Status: DC | PRN

## 2021-06-07 MED ORDER — AMOXICILLIN-POT CLAVULANATE 875-125 MG PO TABS: 1.0000 | ORAL_TABLET | Freq: Two times a day (BID) | ORAL | 0 refills | Status: DC

## 2021-06-07 NOTE — ED Triage Notes (Signed)
Thursday began having dental pain. Reports bottom back molar is broken, upper back tooth contains visible caries

## 2021-06-07 NOTE — ED Provider Notes (Signed)
[facility]    CSN: [id number] Arrival date & time: 06/07/21  0834      History   Chief Complaint No chief complaint on file.   HPI Hannah Hamilton is a 41 y.o. female.   Patient presents with 5-day history of left lower dental pain and associated headache.  She reports that she has a scheduled dentist appointment for tomorrow.  She also reports that she has a broken tooth in the left lower area that she thinks may be attributed to this pain.  Denies any fevers, chills, body aches, drainage from the mouth.    Past Medical History:  Diagnosis Date   Pregnancy induced hypertension     Patient Active Problem List   Diagnosis Date Noted   Depression 03/30/2019   Preeclampsia in postpartum period 03/29/2019   Severe preeclampsia, postpartum condition 03/28/2019   History of cesarean delivery 03/19/2019   Labor and delivery, indication for care 03/18/2019   Congestion of upper respiratory tract 03/12/2019   Carpal tunnel syndrome during pregnancy 10/24/2018   Supervision of high risk pregnancy, antepartum 09/12/2018   Hx of preeclampsia, prior pregnancy, currently pregnant 09/12/2018   Previous cesarean delivery, antepartum 09/12/2018    Past Surgical History:  Procedure Laterality Date   BARTHOLIN GLAND CYST EXCISION     CESAREAN SECTION     CESAREAN SECTION WITH BILATERAL TUBAL LIGATION Bilateral 03/18/2019   Procedure: CESAREAN SECTION WITH BILATERAL TUBAL LIGATION;  Surgeon: Reva Bores, MD;  Location: [facility];  Service: Obstetrics;  Laterality: Bilateral;   TONSILLECTOMY      OB History     Gravida  3   Para  3   Term  3   Preterm      AB      Living  3      SAB      IAB      Ectopic      Multiple  0   Live Births  3            Home Medications    Prior to Admission medications   Medication Sig Start Date End Date Taking? Authorizing Provider  amoxicillin-clavulanate (AUGMENTIN) 875-125 MG tablet Take 1 tablet by mouth  every 12 (twelve) hours. 06/07/21  Yes Cobey Raineri, Rolly Salter E, FNP  lidocaine (XYLOCAINE) 2 % solution Use as directed 15 mLs in the mouth or throat as needed for mouth pain. Swish and spit 06/07/21  Yes Milka Windholz, [place] E, [place]  acetaminophen (TYLENOL) 325 MG tablet Take 2 tablets (650 mg total) by mouth every 6 (six) hours as needed for mild pain (temperature > 101.5.). Patient not taking: Reported on 04/11/2019 03/20/19   Sparacino, Hailey L, DO  albuterol (PROVENTIL HFA;VENTOLIN HFA) 108 (90 Base) MCG/ACT inhaler Inhale 1-2 puffs into the lungs every 6 (six) hours as needed for wheezing or shortness of breath. Patient not taking: Reported on 04/11/2019 08/02/17   Dowless, Lelon Mast Tripp, PA-C  albuterol (VENTOLIN HFA) 108 (90 Base) MCG/ACT inhaler Inhale 2 puffs into the lungs every 4 (four) hours as needed for wheezing or shortness of breath (cough). Patient not taking: Reported on 04/11/2019 03/13/19   Mannie Stabile, PA-C  enalapril (VASOTEC) 10 MG tablet Take 1 tablet (10 mg total) by mouth daily. Patient not taking: Reported on 04/11/2019 03/30/19   Conan Bowens, MD  enalapril (VASOTEC) 20 MG tablet Take 1 tablet (20 mg total) by mouth daily. 04/03/19   Calvert Cantor, CNM  ferrous sulfate  325 (65 FE) MG tablet TAKE 1 TABLET BY MOUTH TWICE A DAY WITH A MEAL 06/20/19   Conan Bowensavis, Kelly M, MD  guaiFENesin (ROBITUSSIN) 100 MG/5ML liquid Take 200 mg by mouth 3 (three) times daily as needed for cough.    [provider]  ibuprofen (ADVIL) 800 MG tablet Take 1 tablet (800 mg total) by mouth 3 (three) times daily with meals. 05/26/20   Mardella LaymanHagler, Brian, MD  oxyCODONE (OXY IR/ROXICODONE) 5 MG immediate release tablet Take 1-2 tablets (5-10 mg total) by mouth every 4 (four) hours as needed for moderate pain. Patient not taking: Reported on 04/03/2019 03/20/19   Sparacino, Hailey L, DO  Prenatal Vit-Fe Fumarate-FA (PREPLUS) 27-1 MG TABS Take 1 tablet by mouth daily. 09/13/18   Federico FlakeNewton, Kimberly Niles, MD   sertraline (ZOLOFT) 50 MG tablet Take 1 tablet (50 mg total) by mouth daily. 04/22/19   Conan Bowensavis, Kelly M, MD  valACYclovir (VALTREX) 1000 MG tablet Take 1 tablet (1,000 mg total) by mouth 3 (three) times daily. 05/26/20   Mardella LaymanHagler, Brian, MD    Family History Family History  Problem Relation Age of Onset   Atrial fibrillation Mother    Heart disease Mother     Social History Social History   Tobacco Use   Smoking status: Every Day    Packs/day: 0.50    Types: Cigarettes   Smokeless tobacco: Never  Vaping Use   Vaping Use: Never used  Substance Use Topics   Alcohol use: Yes    Comment: occasionally   Drug use: No     Allergies   Patient has no known allergies.   Review of Systems Review of Systems Per HPI  Physical Exam Triage Vital Signs ED Triage Vitals  Enc Vitals Group     BP 06/07/21 0945 (!) 143/88     Pulse Rate 06/07/21 0945 (!) 52     Resp 06/07/21 0945 16     Temp 06/07/21 0945 97.9 F (36.6 C)     Temp Source 06/07/21 0945 Oral     SpO2 06/07/21 0945 97 %     Weight --      Height --      Head Circumference --      Peak Flow --      Pain Score 06/07/21 0946 8     Pain Loc --      Pain Edu? --      Excl. in GC? --    No data found.  Updated Vital Signs BP (!) 143/88 (BP Location: Left Arm)    Pulse (!) 52    Temp 97.9 F (36.6 C) (Oral)    Resp 16    SpO2 97%   Visual Acuity Right Eye Distance:   Left Eye Distance:   Bilateral Distance:    Right Eye Near:   Left Eye Near:    Bilateral Near:     Physical Exam Constitutional:      General: She is not in acute distress.    Appearance: Normal appearance. She is not toxic-appearing or diaphoretic.  HENT:     Head: Normocephalic and atraumatic.     Mouth/Throat:     Lips: Pink.     Mouth: Mucous membranes are moist.     Dentition: Abnormal dentition. Dental tenderness, gingival swelling and dental caries present.      Comments: Patient has a broken tooth to the left lower back area of  dentition.  Also has mild gingival swelling surrounding this tooth. Eyes:  Extraocular Movements: Extraocular movements intact.     Conjunctiva/sclera: Conjunctivae normal.  Pulmonary:     Effort: Pulmonary effort is normal.  Neurological:     General: No focal deficit present.     Mental Status: She is alert and oriented to person, place, and time. Mental status is at baseline.  Psychiatric:        Mood and Affect: Mood normal.        Behavior: Behavior normal.        Thought Content: Thought content normal.        Judgment: Judgment normal.     UC Treatments / Results  Labs (all labs ordered are listed, but only abnormal results are displayed) Labs Reviewed - No data to display  EKG   Radiology No results found.  Procedures Procedures (including critical care time)  Medications Ordered in UC Medications - No data to display  Initial Impression / Assessment and Plan / UC Course  I have reviewed the triage vital signs and the nursing notes.  Pertinent labs & imaging results that were available during my care of the patient were reviewed by me and considered in my medical decision making (see chart for details).     No obvious dental abscess but patient does have mild gingival swelling noted to the left lower tooth that could indicate dental infection.  Will opt to treat with Augmentin antibiotic.  Viscous lidocaine also prescribed to help alleviate patient's pain.  Patient advised of the importance of following up with dentist at scheduled appointment tomorrow for further evaluation and management.  Patient verbalized understanding and was agreeable with plan. Final Clinical Impressions(s) / UC Diagnoses   Final diagnoses:  Dental infection  Pain, dental     Discharge Instructions      You have been prescribed antibiotic and a lidocaine solution to help alleviate dental infection and pain.  Please follow-up with dentist tomorrow for further evaluation and  management.    ED Prescriptions     Medication Sig Dispense Auth. Provider   amoxicillin-clavulanate (AUGMENTIN) 875-125 MG tablet Take 1 tablet by mouth every 12 (twelve) hours. 14 tablet G. L. Garci­a, [place] E, [place]   lidocaine (XYLOCAINE) 2 % solution Use as directed 15 mLs in the mouth or throat as needed for mouth pain. Swish and spit 100 mL Gustavus Bryant, [place]      PDMP not reviewed this encounter.   Gustavus Bryant, [place] 06/07/21 1052

## 2021-06-07 NOTE — Discharge Instructions (Addendum)
You have been prescribed antibiotic and a lidocaine solution to help alleviate dental infection and pain.  Please follow-up with dentist tomorrow for further evaluation and management.

## 2021-08-09 ENCOUNTER — Other Ambulatory Visit: Payer: Self-pay

## 2021-08-09 ENCOUNTER — Ambulatory Visit
Admission: EM | Admit: 2021-08-09 | Discharge: 2021-08-09 | Disposition: A | Discharge status: Home / Self Care | Payer: Medicaid Other | Attending: Physician Assistant | Admitting: Physician Assistant

## 2021-08-09 DIAGNOSIS — J020 Streptococcal pharyngitis: Secondary | ICD-10-CM

## 2021-08-09 LAB — POCT RAPID STREP A (OFFICE): Rapid Strep A Screen: POSITIVE — AB

## 2021-08-09 MED ORDER — AMOXICILLIN 500 MG PO CAPS: 500.0000 mg | ORAL_CAPSULE | Freq: Three times a day (TID) | ORAL | 0 refills | Status: DC

## 2021-08-09 NOTE — ED Provider Notes (Signed)
[facility]    CSN: [id number] Arrival date & time: 08/09/21  1419      History   Chief Complaint Chief Complaint  Patient presents with   Sore Throat   Fever   Generalized Body Aches    HPI Hannah Hamilton is a 41 y.o. female.   Patient here today with daughter for evaluation of sore throat, fever, fatigue, cough and generalized body aches she has had the last 4 days. Her other daughter recently tested positive for strep. She has not had any vomiting or diarrhea. She has tried taking OTC pain medication with mild relief.   The history is provided by the patient.  Sore Throat Pertinent negatives include no abdominal pain and no shortness of breath.  Fever Associated symptoms: chills, congestion, cough, myalgias and sore throat   Associated symptoms: no diarrhea, no ear pain, no nausea and no vomiting    Past Medical History:  Diagnosis Date   Pregnancy induced hypertension     Patient Active Problem List   Diagnosis Date Noted   Depression 03/30/2019   Preeclampsia in postpartum period 03/29/2019   Severe preeclampsia, postpartum condition 03/28/2019   History of cesarean delivery 03/19/2019   Labor and delivery, indication for care 03/18/2019   Congestion of upper respiratory tract 03/12/2019   Carpal tunnel syndrome during pregnancy 10/24/2018   Supervision of high risk pregnancy, antepartum 09/12/2018   Hx of preeclampsia, prior pregnancy, currently pregnant 09/12/2018   Previous cesarean delivery, antepartum 09/12/2018    Past Surgical History:  Procedure Laterality Date   BARTHOLIN GLAND CYST EXCISION     CESAREAN SECTION     CESAREAN SECTION WITH BILATERAL TUBAL LIGATION Bilateral 03/18/2019   Procedure: CESAREAN SECTION WITH BILATERAL TUBAL LIGATION;  Surgeon: Donnamae Jude, MD;  Location: [facility];  Service: Obstetrics;  Laterality: Bilateral;   TONSILLECTOMY      OB History     Gravida  3   Para  3   Term  3   Preterm      AB       Living  3      SAB      IAB      Ectopic      Multiple  0   Live Births  3            Home Medications    Prior to Admission medications   Medication Sig Start Date End Date Taking? Authorizing Provider  amoxicillin (AMOXIL) 500 MG capsule Take 1 capsule (500 mg total) by mouth 3 (three) times daily. 08/09/21  Yes Francene Finders, PA-C  acetaminophen (TYLENOL) 325 MG tablet Take 2 tablets (650 mg total) by mouth every 6 (six) hours as needed for mild pain (temperature > 101.5.). Patient not taking: Reported on 04/11/2019 03/20/19   Sparacino, Hailey L, DO  albuterol (PROVENTIL HFA;VENTOLIN HFA) 108 (90 Base) MCG/ACT inhaler Inhale 1-2 puffs into the lungs every 6 (six) hours as needed for wheezing or shortness of breath. Patient not taking: Reported on 04/11/2019 08/02/17   Dowless, Aldona Bar Tripp, PA-C  albuterol (VENTOLIN HFA) 108 (90 Base) MCG/ACT inhaler Inhale 2 puffs into the lungs every 4 (four) hours as needed for wheezing or shortness of breath (cough). Patient not taking: Reported on 04/11/2019 03/13/19   Suzy Bouchard, PA-C  enalapril (VASOTEC) 10 MG tablet Take 1 tablet (10 mg total) by mouth daily. Patient not taking: Reported on 04/11/2019 03/30/19   Sloan Leiter, MD  enalapril (VASOTEC) 20 MG tablet Take 1 tablet (20 mg total) by mouth daily. 04/03/19   Darlina Rumpf, CNM  ferrous sulfate 325 (65 FE) MG tablet TAKE 1 TABLET BY MOUTH TWICE A DAY WITH A MEAL 06/20/19   Sloan Leiter, MD  guaiFENesin (ROBITUSSIN) 100 MG/5ML liquid Take 200 mg by mouth 3 (three) times daily as needed for cough.    [provider]  ibuprofen (ADVIL) 800 MG tablet Take 1 tablet (800 mg total) by mouth 3 (three) times daily with meals. 05/26/20   Vanessa Kick, MD  lidocaine (XYLOCAINE) 2 % solution Use as directed 15 mLs in the mouth or throat as needed for mouth pain. Swish and spit 06/07/21   Oswaldo Conroy E, FNP  oxyCODONE (OXY IR/ROXICODONE) 5 MG immediate  release tablet Take 1-2 tablets (5-10 mg total) by mouth every 4 (four) hours as needed for moderate pain. Patient not taking: Reported on 04/03/2019 03/20/19   Sparacino, Hailey L, DO  Prenatal Vit-Fe Fumarate-FA (PREPLUS) 27-1 MG TABS Take 1 tablet by mouth daily. 09/13/18   Caren Macadam, MD  sertraline (ZOLOFT) 50 MG tablet Take 1 tablet (50 mg total) by mouth daily. 04/22/19   Sloan Leiter, MD  valACYclovir (VALTREX) 1000 MG tablet Take 1 tablet (1,000 mg total) by mouth 3 (three) times daily. 05/26/20   Vanessa Kick, MD    Family History Family History  Problem Relation Age of Onset   Atrial fibrillation Mother    Heart disease Mother     Social History Social History   Tobacco Use   Smoking status: Every Day    Packs/day: 0.50    Types: Cigarettes   Smokeless tobacco: Never  Vaping Use   Vaping Use: Never used  Substance Use Topics   Alcohol use: Yes    Comment: occasionally   Drug use: No     Allergies   Patient has no known allergies.   Review of Systems Review of Systems  Constitutional:  Positive for chills and fever.  HENT:  Positive for congestion and sore throat. Negative for ear pain.   Eyes:  Negative for discharge and redness.  Respiratory:  Positive for cough. Negative for shortness of breath and wheezing.   Gastrointestinal:  Negative for abdominal pain, diarrhea, nausea and vomiting.  Musculoskeletal:  Positive for myalgias.    Physical Exam Triage Vital Signs ED Triage Vitals  Enc Vitals Group     BP 08/09/21 1554 109/72     Pulse Rate 08/09/21 1554 89     Resp 08/09/21 1554 20     Temp 08/09/21 1554 100.2 F (37.9 C)     Temp Source 08/09/21 1554 Oral     SpO2 08/09/21 1554 95 %     Weight --      Height --      Head Circumference --      Peak Flow --      Pain Score 08/09/21 1556 7     Pain Loc --      Pain Edu? --      Excl. in [place]? --    No data found.  Updated Vital Signs BP 109/72 (BP Location: Right Arm)    Pulse  89    Temp 100.2 F (37.9 C) (Oral)    Resp 20    LMP 08/06/2021    SpO2 95%      Physical Exam Vitals and nursing note reviewed.  Constitutional:      General:  She is not in acute distress.    Appearance: Normal appearance. She is not ill-appearing.  HENT:     Head: Normocephalic and atraumatic.     Nose: Congestion present.     Mouth/Throat:     Mouth: Mucous membranes are moist.     Pharynx: Posterior oropharyngeal erythema present. No oropharyngeal exudate.  Eyes:     Conjunctiva/sclera: Conjunctivae normal.  Cardiovascular:     Rate and Rhythm: Normal rate.  Pulmonary:     Effort: Pulmonary effort is normal. No respiratory distress.  Skin:    General: Skin is warm and dry.  Neurological:     Mental Status: She is alert.  Psychiatric:        Mood and Affect: Mood normal.        Thought Content: Thought content normal.     UC Treatments / Results  Labs (all labs ordered are listed, but only abnormal results are displayed) Labs Reviewed  POCT RAPID STREP A (OFFICE) - Abnormal; Notable for the following components:      Result Value   Rapid Strep A Screen Positive (*)    All other components within normal limits    EKG   Radiology No results found.  Procedures Procedures (including critical care time)  Medications Ordered in UC Medications - No data to display  Initial Impression / Assessment and Plan / UC Course  I have reviewed the triage vital signs and the nursing notes.  Pertinent labs & imaging results that were available during my care of the patient were reviewed by me and considered in my medical decision making (see chart for details).    Strep test positive. Will treat with amoxicillin. Encouraged follow up if symptoms fail to improve with treatment or worsen in any way.   Final Clinical Impressions(s) / UC Diagnoses   Final diagnoses:  Streptococcal sore throat   Discharge Instructions   None    ED Prescriptions     Medication Sig  Dispense Auth. Provider   amoxicillin (AMOXIL) 500 MG capsule Take 1 capsule (500 mg total) by mouth 3 (three) times daily. 21 capsule Francene Finders, PA-C      PDMP not reviewed this encounter.   Francene Finders, PA-C 08/09/21 1743

## 2021-08-09 NOTE — ED Triage Notes (Signed)
Pt presents with sore throat, fever, fatigue, and generalized body aches X 4 days. ?

## 2022-01-08 DIAGNOSIS — R051 Acute cough: Secondary | ICD-10-CM | POA: Diagnosis not present

## 2022-01-08 DIAGNOSIS — R918 Other nonspecific abnormal finding of lung field: Secondary | ICD-10-CM | POA: Diagnosis not present

## 2022-01-08 DIAGNOSIS — M79672 Pain in left foot: Secondary | ICD-10-CM | POA: Diagnosis not present

## 2022-01-08 DIAGNOSIS — J189 Pneumonia, unspecified organism: Secondary | ICD-10-CM | POA: Diagnosis not present

## 2022-01-08 DIAGNOSIS — M19072 Primary osteoarthritis, left ankle and foot: Secondary | ICD-10-CM | POA: Diagnosis not present

## 2022-01-14 DIAGNOSIS — J209 Acute bronchitis, unspecified: Secondary | ICD-10-CM | POA: Diagnosis not present

## 2022-10-01 DIAGNOSIS — R062 Wheezing: Secondary | ICD-10-CM | POA: Diagnosis not present

## 2022-10-01 DIAGNOSIS — R0989 Other specified symptoms and signs involving the circulatory and respiratory systems: Secondary | ICD-10-CM | POA: Diagnosis not present

## 2022-10-01 DIAGNOSIS — R059 Cough, unspecified: Secondary | ICD-10-CM | POA: Diagnosis not present

## 2022-10-01 DIAGNOSIS — F17200 Nicotine dependence, unspecified, uncomplicated: Secondary | ICD-10-CM | POA: Diagnosis not present

## 2022-12-25 ENCOUNTER — Ambulatory Visit
Admission: EM | Admit: 2022-12-25 | Discharge: 2022-12-25 | Disposition: A | Discharge status: Home / Self Care | Payer: Medicaid Other | Attending: Emergency Medicine | Admitting: Emergency Medicine

## 2022-12-25 ENCOUNTER — Encounter: Payer: Self-pay | Admitting: Emergency Medicine

## 2022-12-25 DIAGNOSIS — M5010 Cervical disc disorder with radiculopathy, unspecified cervical region: Secondary | ICD-10-CM

## 2022-12-25 MED ORDER — BACLOFEN 10 MG PO TABS: 10.0000 mg | ORAL_TABLET | Freq: Three times a day (TID) | ORAL | 0 refills | Status: DC

## 2022-12-25 MED ORDER — PREDNISONE 10 MG (21) PO TBPK: ORAL_TABLET | ORAL | 0 refills | Status: DC

## 2022-12-25 NOTE — ED Triage Notes (Signed)
Patient reports left sided neck pain that started over a week ago.  Patient reports pain that radiates down into her left arm and hand.  Patient reports muscle weakness in her left arm.

## 2022-12-25 NOTE — ED Provider Notes (Signed)
[facility]    CSN: [id number] Arrival date & time: 12/25/22  1400      History   Chief Complaint Chief Complaint  Patient presents with   Neck Pain    HPI Hannah Hamilton is a 42 y.o. female.   HPI  42 year old female with no significant past medical history presents for evaluation of left-sided neck pain that radiates into the left shoulder and is causing weakness in her grip and numbness in her middle, ring, and little finger of her left hand.  The pain has been going on for the last week and a half.  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  Past Medical History:  Diagnosis Date   Pregnancy induced hypertension     Patient Active Problem List   Diagnosis Date Noted   Depression 03/30/2019   Preeclampsia in postpartum period 03/29/2019   Severe preeclampsia, postpartum condition 03/28/2019   History of cesarean delivery 03/19/2019   Labor and delivery, indication for care 03/18/2019   Congestion of upper respiratory tract 03/12/2019   Carpal tunnel syndrome during pregnancy 10/24/2018   Supervision of high risk pregnancy, antepartum 09/12/2018   Hx of preeclampsia, prior pregnancy, currently pregnant 09/12/2018   Previous cesarean delivery, antepartum 09/12/2018    Past Surgical History:  Procedure Laterality Date   BARTHOLIN GLAND CYST EXCISION     CESAREAN SECTION     CESAREAN SECTION WITH BILATERAL TUBAL LIGATION Bilateral 03/18/2019   Procedure: CESAREAN SECTION WITH BILATERAL TUBAL LIGATION;  Surgeon: Reva Bores, MD;  Location: [facility];  Service: Obstetrics;  Laterality: Bilateral;   TONSILLECTOMY      OB History     Gravida  3   Para  3   Term  3   Preterm      AB      Living  3      SAB      IAB      Ectopic      Multiple  0   Live Births  3            Home Medications    Prior to Admission medications   Medication Sig Start Date End Date  Taking? Authorizing Provider  baclofen (LIORESAL) 10 MG tablet Take 1 tablet (10 mg total) by mouth 3 (three) times daily. 12/25/22  Yes Becky Augusta, NP  predniSONE (STERAPRED UNI-PAK 21 TAB) 10 MG (21) TBPK tablet Take 6 tablets on day 1, 5 tablets day 2, 4 tablets day 3, 3 tablets day 4, 2 tablets day 5, 1 tablet day 6 12/25/22  Yes Becky Augusta, NP  acetaminophen (TYLENOL) 325 MG tablet Take 2 tablets (650 mg total) by mouth every 6 (six) hours as needed for mild pain (temperature > 101.5.). Patient not taking: Reported on 04/11/2019 03/20/19   Sparacino, Hailey L, DO  enalapril (VASOTEC) 10 MG tablet Take 1 tablet (10 mg total) by mouth daily. Patient not taking: Reported on 04/11/2019 03/30/19   Conan Bowens, MD  guaiFENesin (ROBITUSSIN) 100 MG/5ML liquid Take 200 mg by mouth 3 (three) times daily as needed for cough.    [provider]  ibuprofen (ADVIL) 800 MG tablet Take 1 tablet (800 mg total) by mouth 3 (three) times daily with meals. 05/26/20   Mardella Layman, MD    Family History Family History  Problem Relation Age of Onset   Atrial fibrillation Mother  Heart disease Mother     Social History Social History   Tobacco Use   Smoking status: Every Day    Current packs/day: 0.50    Types: Cigarettes   Smokeless tobacco: Never  Vaping Use   Vaping status: Never Used  Substance Use Topics   Alcohol use: Yes    Comment: occasionally   Drug use: No     Allergies   Patient has no known allergies.   Review of Systems Review of Systems  Constitutional:  Negative for fever.  Musculoskeletal:  Positive for myalgias and neck pain.  Neurological:  Positive for weakness and numbness.     Physical Exam Triage Vital Signs ED Triage Vitals [12/25/22 1413]  Encounter Vitals Group     BP      Systolic BP Percentile      Diastolic BP Percentile      Pulse      Resp      Temp      Temp src      SpO2      Weight 200 lb (90.7 kg)     Height 5\' 4"  (1.626 m)      Head Circumference      Peak Flow      Pain Score 7     Pain Loc      Pain Education      Exclude from Growth Chart    No data found.  Updated Vital Signs BP 129/71 (BP Location: Right Arm)   Pulse 64   Temp 98.3 F (36.8 C) (Oral)   Resp 14   Ht 5\' 4"  (1.626 m)   Wt 200 lb (90.7 kg)   LMP 12/11/2022 (Approximate)   SpO2 98%   Breastfeeding No   BMI 34.33 kg/m   Visual Acuity Right Eye Distance:   Left Eye Distance:   Bilateral Distance:    Right Eye Near:   Left Eye Near:    Bilateral Near:     Physical Exam Vitals and nursing note reviewed.  Constitutional:      Appearance: Normal appearance.  Musculoskeletal:        General: Tenderness present. No swelling, deformity or signs of injury. Normal range of motion.  Skin:    General: Skin is warm and dry.     Capillary Refill: Capillary refill takes less than 2 seconds.  Neurological:     General: No focal deficit present.     Mental Status: She is alert and oriented to person, place, and time.     Sensory: Sensory deficit present.     Motor: No weakness.      UC Treatments / Results  Labs (all labs ordered are listed, but only abnormal results are displayed) Labs Reviewed - No data to display  EKG   Radiology No results found.  Procedures Procedures (including critical care time)  Medications Ordered in UC Medications - No data to display  Initial Impression / Assessment and Plan / UC Course  I have reviewed the triage vital signs and the nursing notes.  Pertinent labs & imaging results that were available during my care of the patient were reviewed by me and considered in my medical decision making (see chart for details).   Patient is a pleasant, nontoxic-appearing 42 year old female presenting for evaluation of left-sided neck pain with paresthesias in her left hand as outlined in HPI above.  On exam she has no midline spinous process tenderness or step-off.  No pain or tension in the  cervical muscles of either left or right paraspinous region.  She does have some mild tension in her upper trapezius and the left shoulder.  Her bilateral grips and upper extremity strength are both 5/5.  She does have decreased sensation to the dorsal aspect of her left middle finger as well as to the dorsal aspect of the left thumb when asked to distinguish between sharp and dull sensations.  She is complaining of paresthesias in her middle, ring, and pinky finger of her left hand.  I suspect that the patient's pain is secondary to a cervical disc issue.  I will refer her to spine clinic for further evaluation and treatment.  I will bridge her until then with a 6-day prednisone taper to help decrease inflammation in both her muscle and the nerve along with nonsedating muscle laxer baclofen every 8 hours to help with muscle tension.  We will also do a trial of home physical therapy.   Final Clinical Impressions(s) / UC Diagnoses   Final diagnoses:  Cervical disc disorder with radiculopathy of cervical region     Discharge Instructions      Take the prednisone each morning at breakfast for 6 days to help decrease inflammation and aid in pain relief.  Use the Baclofen every 6 hours as needed for muscle tension and pain.  I have referred you to the spine clinic for further evaluation and treatment.   Follow the home physical therapy exercises given in your discharge packet.     ED Prescriptions     Medication Sig Dispense Auth. Provider   baclofen (LIORESAL) 10 MG tablet Take 1 tablet (10 mg total) by mouth 3 (three) times daily. 30 each Becky Augusta, NP   predniSONE (STERAPRED UNI-PAK 21 TAB) 10 MG (21) TBPK tablet Take 6 tablets on day 1, 5 tablets day 2, 4 tablets day 3, 3 tablets day 4, 2 tablets day 5, 1 tablet day 6 21 tablet Becky Augusta, NP      I have reviewed the PDMP during this encounter.   Becky Augusta, NP 12/25/22 1434

## 2022-12-25 NOTE — Discharge Instructions (Addendum)
Take the prednisone each morning at breakfast for 6 days to help decrease inflammation and aid in pain relief.  Use the Baclofen every 6 hours as needed for muscle tension and pain.  I have referred you to the spine clinic for further evaluation and treatment.   Follow the home physical therapy exercises given in your discharge packet.

## 2023-02-25 DIAGNOSIS — F17210 Nicotine dependence, cigarettes, uncomplicated: Secondary | ICD-10-CM | POA: Diagnosis not present

## 2023-02-25 DIAGNOSIS — N750 Cyst of Bartholin's gland: Secondary | ICD-10-CM | POA: Diagnosis not present

## 2023-02-26 ENCOUNTER — Other Ambulatory Visit: Payer: Self-pay

## 2023-02-26 ENCOUNTER — Emergency Department
Admission: EM | Admit: 2023-02-26 | Discharge: 2023-02-26 | Disposition: A | Discharge status: Home / Self Care | Payer: Medicaid Other | Attending: Emergency Medicine | Admitting: Emergency Medicine

## 2023-02-26 DIAGNOSIS — N750 Cyst of Bartholin's gland: Secondary | ICD-10-CM | POA: Insufficient documentation

## 2023-02-26 MED ORDER — CEPHALEXIN 250 MG PO CAPS: 250.0000 mg | ORAL_CAPSULE | Freq: Two times a day (BID) | ORAL | 0 refills | Status: AC

## 2023-02-26 MED ORDER — HYDROCODONE-ACETAMINOPHEN 5-325 MG PO TABS: 1.0000 | ORAL_TABLET | ORAL | 0 refills | Status: DC | PRN

## 2023-02-26 NOTE — ED Provider Notes (Signed)
[facility] Emergency Department Provider Note     Event Date/Time   First MD Initiated Contact with Patient 02/26/23 2207     (approximate)   History   Cyst   HPI  Hannah Hamilton is a 42 y.o. female who presents to the ED after leaving without being discharged at [facility] emergency department last night following a Bartholins cyst on her right minor labia.  Patient reports no pain medication or antibiotics were prescribed.  She is complaining of severe pain that is worsened with sitting and walking.  Patient reports word catheter fell out this morning denies fever and chills.  Denies vaginal bleeding or urinary symptoms.    Physical Exam   Triage Vital Signs: ED Triage Vitals  Encounter Vitals Group     BP 02/26/23 2119 130/88     Systolic BP Percentile --      Diastolic BP Percentile --      Pulse Rate 02/26/23 2119 78     Resp 02/26/23 2119 18     Temp 02/26/23 2119 98.3 F (36.8 C)     Temp Source 02/26/23 2119 Oral     SpO2 02/26/23 2119 99 %     Weight 02/26/23 2120 210 lb (95.3 kg)     Height 02/26/23 2120 5\' 4"  (1.626 m)     Head Circumference --      Peak Flow --      Pain Score 02/26/23 2123 8     Pain Loc --      Pain Education --      Exclude from Growth Chart --     Most recent vital signs: Vitals:   02/26/23 2119 02/26/23 2255  BP: 130/88 124/78  Pulse: 78 69  Resp: 18 16  Temp: 98.3 F (36.8 C)   SpO2: 99% 99%    General Well-appearing.  Awake, no distress.  HEENT NCAT. PERRL. EOMI. No rhinorrhea. Mucous membranes are moist.  CV:  Good peripheral perfusion.  RESP:  Normal effort.  ABD:  No distention.  GU:  There is a 1 cm incision over right minora labia.  No noted signs of infection.  TTP.  ED Results / Procedures / Treatments   Labs (all labs ordered are listed, but only abnormal results are displayed) Labs Reviewed - No data to display  No results found.  PROCEDURES:  Critical Care performed:  No  Procedures  MEDICATIONS ORDERED IN ED: Medications - No data to display   IMPRESSION / MDM / ASSESSMENT AND PLAN / ED COURSE  I reviewed the triage vital signs and the nursing notes.                               42 y.o. female presents to the emergency department for evaluation and treatment of Bartholin cyst. See HPI for further details.  Patient's presentation is most consistent with acute, uncomplicated illness.  Patient is alert and oriented.  GU exam as stated above.  No noted signs of infection.  Patient is stable for discharge home.  Will prescribe pain medication and short course of antibiotics.  Patient is encouraged to follow-up with OB/GYN for further evaluation as needed.  ED precautions are discussed with patient for any worsening or new symptoms.   FINAL CLINICAL IMPRESSION(S) / ED DIAGNOSES   Final diagnoses:  Bartholin's cyst   Rx / DC Orders   ED Discharge Orders  Ordered    cephALEXin (KEFLEX) 250 MG capsule  2 times daily        02/26/23 2251    HYDROcodone-acetaminophen (NORCO/VICODIN) 5-325 MG tablet  Every 4 hours PRN        02/26/23 2251           Note:  This document was prepared using Dragon voice recognition software and may include unintentional dictation errors.    Romeo Apple, Oree Mirelez A, PA-C 02/27/23 1806    Sharman Cheek, MD 03/02/23 1440

## 2023-02-26 NOTE — ED Triage Notes (Signed)
Pt to ed from home via POV for barthilon cyst in her vaginal region. Pt advised she was seen at [facility] last night and left after they "lanced her cyst" due to the long wait. Pt asked for pain med and abx. Pt is coax4, in no acute distress and ambulatory in triage.

## 2023-02-26 NOTE — Discharge Instructions (Addendum)
Please review Bartholin cyst incision and drainage care bided for you.  Follow-up with your OB/GYN if symptoms do not improve.

## 2023-04-18 ENCOUNTER — Ambulatory Visit
Admission: RE | Admit: 2023-04-18 | Discharge: 2023-04-18 | Disposition: A | Discharge status: Home / Self Care | Payer: Medicaid Other | Source: Ambulatory Visit | Attending: Physician Assistant | Admitting: Physician Assistant

## 2023-04-18 VITALS — BP 129/90 | HR 65 | Temp 98.7°F | Resp 18

## 2023-04-18 DIAGNOSIS — R0602 Shortness of breath: Secondary | ICD-10-CM

## 2023-04-18 DIAGNOSIS — J180 Bronchopneumonia, unspecified organism: Secondary | ICD-10-CM | POA: Diagnosis not present

## 2023-04-18 DIAGNOSIS — R051 Acute cough: Secondary | ICD-10-CM | POA: Diagnosis not present

## 2023-04-18 LAB — SARS CORONAVIRUS 2 BY RT PCR: SARS Coronavirus 2 by RT PCR: NEGATIVE

## 2023-04-18 MED ORDER — PREDNISONE 20 MG PO TABS: 40.0000 mg | ORAL_TABLET | Freq: Every day | ORAL | 0 refills | Status: AC

## 2023-04-18 MED ORDER — ALBUTEROL SULFATE (2.5 MG/3ML) 0.083% IN NEBU
2.5000 mg | INHALATION_SOLUTION | Freq: Once | RESPIRATORY_TRACT | Status: AC
  Administered 2023-04-18: 2.5 mg via RESPIRATORY_TRACT

## 2023-04-18 MED ORDER — DOXYCYCLINE HYCLATE 100 MG PO CAPS: 100.0000 mg | ORAL_CAPSULE | Freq: Two times a day (BID) | ORAL | 0 refills | Status: AC

## 2023-04-18 MED ORDER — PROMETHAZINE-DM 6.25-15 MG/5ML PO SYRP: 5.0000 mL | ORAL_SOLUTION | Freq: Four times a day (QID) | ORAL | 0 refills | Status: DC | PRN

## 2023-04-18 NOTE — ED Provider Notes (Signed)
[facility]    CSN: [id number] Arrival date & time: 04/18/23  1544      History   Chief Complaint Chief Complaint  Patient presents with   Cough    Deep coughing an painful. Short on breath when active. Upper back hurts. Nose running with sinus pressure - Entered by patient   Shortness of Breath   Nasal Congestion   Wheezing    HPI Hannah Hamilton is a 42 y.o. female presenting for 3-day history of fatigue, cough, congestion, chest tightness/pressure, wheezing or shortness of breath.  Patient reports history of bronchitis and pneumonia every year.  She was diagnosed with pneumonia last year around this time and took antibiotics and got better.  She has no symptoms feel the same.  Denies any fever.  Has used albuterol inhaler as needed.  No history of asthma or COPD but she is a smoker.  HPI  Past Medical History:  Diagnosis Date   Pregnancy induced hypertension     Patient Active Problem List   Diagnosis Date Noted   Depression 03/30/2019   Preeclampsia in postpartum period 03/29/2019   Severe preeclampsia, postpartum condition 03/28/2019   History of cesarean delivery 03/19/2019   Labor and delivery, indication for care 03/18/2019   Congestion of upper respiratory tract 03/12/2019   Carpal tunnel syndrome during pregnancy 10/24/2018   Supervision of high risk pregnancy, antepartum 09/12/2018   Hx of preeclampsia, prior pregnancy, currently pregnant 09/12/2018   Previous cesarean delivery, antepartum 09/12/2018    Past Surgical History:  Procedure Laterality Date   BARTHOLIN GLAND CYST EXCISION     CESAREAN SECTION     CESAREAN SECTION WITH BILATERAL TUBAL LIGATION Bilateral 03/18/2019   Procedure: CESAREAN SECTION WITH BILATERAL TUBAL LIGATION;  Surgeon: Reva Bores, MD;  Location: [facility];  Service: Obstetrics;  Laterality: Bilateral;   TONSILLECTOMY      OB History     Gravida  3   Para  3   Term  3   Preterm      AB      Living  3       SAB      IAB      Ectopic      Multiple  0   Live Births  3            Home Medications    Prior to Admission medications   Medication Sig Start Date End Date Taking? Authorizing Provider  doxycycline (VIBRAMYCIN) 100 MG capsule Take 1 capsule (100 mg total) by mouth 2 (two) times daily for 7 days. 04/18/23 04/25/23 Yes Shirlee Latch, PA-C  predniSONE (DELTASONE) 20 MG tablet Take 2 tablets (40 mg total) by mouth daily for 5 days. 04/18/23 04/23/23 Yes Shirlee Latch, PA-C  promethazine-dextromethorphan (PROMETHAZINE-DM) 6.25-15 MG/5ML syrup Take 5 mLs by mouth 4 (four) times daily as needed. 04/18/23  Yes Eusebio Friendly B, PA-C  baclofen (LIORESAL) 10 MG tablet Take 1 tablet (10 mg total) by mouth 3 (three) times daily. 12/25/22   Becky Augusta, NP  enalapril (VASOTEC) 10 MG tablet Take 1 tablet (10 mg total) by mouth daily. Patient not taking: Reported on 04/11/2019 03/30/19   Conan Bowens, MD  guaiFENesin (ROBITUSSIN) 100 MG/5ML liquid Take 200 mg by mouth 3 (three) times daily as needed for cough.    [provider]  HYDROcodone-acetaminophen (NORCO/VICODIN) 5-325 MG tablet Take 1 tablet by mouth every 4 (four) hours as needed for moderate pain. 02/26/23  02/26/24  Romeo Apple, Myah A, PA-C  ibuprofen (ADVIL) 800 MG tablet Take 1 tablet (800 mg total) by mouth 3 (three) times daily with meals. 05/26/20   Mardella Layman, MD    Family History Family History  Problem Relation Age of Onset   Atrial fibrillation Mother    Heart disease Mother     Social History Social History   Tobacco Use   Smoking status: Every Day    Current packs/day: 0.50    Types: Cigarettes   Smokeless tobacco: Never  Vaping Use   Vaping status: Never Used  Substance Use Topics   Alcohol use: Yes    Comment: occasionally   Drug use: No     Allergies   Patient has no known allergies.   Review of Systems Review of Systems  Constitutional:  Positive for fatigue. Negative for  chills, diaphoresis and fever.  HENT:  Positive for congestion and rhinorrhea. Negative for ear pain, sinus pressure, sinus pain and sore throat.   Respiratory:  Positive for cough, shortness of breath and wheezing.   Cardiovascular:  Negative for chest pain.  Gastrointestinal:  Negative for abdominal pain, nausea and vomiting.  Musculoskeletal:  Positive for back pain. Negative for myalgias.  Skin:  Negative for rash.  Neurological:  Negative for weakness and headaches.  Hematological:  Negative for adenopathy.     Physical Exam Triage Vital Signs ED Triage Vitals  Encounter Vitals Group     BP 04/18/23 1558 (!) 129/90     Systolic BP Percentile --      Diastolic BP Percentile --      Pulse Rate 04/18/23 1558 65     Resp 04/18/23 1558 18     Temp 04/18/23 1558 98.7 F (37.1 C)     Temp Source 04/18/23 1558 Oral     SpO2 04/18/23 1558 97 %     Weight --      Height --      Head Circumference --      Peak Flow --      Pain Score 04/18/23 1557 7     Pain Loc --      Pain Education --      Exclude from Growth Chart --    No data found.  Updated Vital Signs BP (!) 129/90 (BP Location: Right Arm)   Pulse 65   Temp 98.7 F (37.1 C) (Oral)   Resp 18   LMP  (LMP Unknown)   SpO2 97%    Physical Exam Vitals and nursing note reviewed.  Constitutional:      General: She is not in acute distress.    Appearance: Normal appearance. She is not ill-appearing or toxic-appearing.  HENT:     Head: Normocephalic and atraumatic.     Nose: Congestion present.     Mouth/Throat:     Mouth: Mucous membranes are moist.     Pharynx: Oropharynx is clear.  Eyes:     General: No scleral icterus.       Right eye: No discharge.        Left eye: No discharge.     Conjunctiva/sclera: Conjunctivae normal.  Cardiovascular:     Rate and Rhythm: Normal rate and regular rhythm.     Heart sounds: Normal heart sounds.  Pulmonary:     Effort: Pulmonary effort is normal. No respiratory  distress.     Breath sounds: Wheezing (LLL) and rhonchi (LLL, LUL) present.  Musculoskeletal:     Cervical back: Neck supple.  Skin:    General: Skin is dry.  Neurological:     General: No focal deficit present.     Mental Status: She is alert. Mental status is at baseline.     Motor: No weakness.     Gait: Gait normal.  Psychiatric:        Mood and Affect: Mood normal.        Behavior: Behavior normal.      UC Treatments / Results  Labs (all labs ordered are listed, but only abnormal results are displayed) Labs Reviewed  SARS CORONAVIRUS 2 BY RT PCR    EKG   Radiology No results found.  Procedures Procedures (including critical care time)  Medications Ordered in UC Medications  albuterol (PROVENTIL) (2.5 MG/3ML) 0.083% nebulizer solution 2.5 mg (2.5 mg Nebulization Given 04/18/23 1643)    Initial Impression / Assessment and Plan / UC Course  I have reviewed the triage vital signs and the nursing notes.  Pertinent labs & imaging results that were available during my care of the patient were reviewed by me and considered in my medical decision making (see chart for details).   42 year old female with history of tobacco abuse and bronchitis/pneumonia presents for 3-day history of fatigue, productive cough, congestion, chest pressure/tightness, wheezing and shortness of breath.  Denies fever.  She is afebrile.  Overall well-appearing.  Coughs frequently.  On exam has mild nasal congestion, wheezing and rhonchi of left lower lung field and rhonchi of left upper lung field.  COVID-negative.  Patient given albuterol nebulizer.    Suspect bronchopneumonia.  Given her history of recurrent bronchitis and pneumonia we will cover her for bacterial infection.  Sent doxycycline, prednisone and Promethazine DM to pharmacy. Reviewed returning for fever, worsening cough, increased shortness of breath.  Work note given   Final Clinical Impressions(s) / UC Diagnoses   Final  diagnoses:  Bronchopneumonia  Acute cough  Shortness of breath     Discharge Instructions      -Negative COVID test -I sent antibiotics, steroids and cough meds. Use inhaler as needed for shortness of breath -Return if fever, worsening cough or worsening shortness of breath     ED Prescriptions     Medication Sig Dispense Auth. Provider   promethazine-dextromethorphan (PROMETHAZINE-DM) 6.25-15 MG/5ML syrup Take 5 mLs by mouth 4 (four) times daily as needed. 118 mL Eusebio Friendly B, PA-C   doxycycline (VIBRAMYCIN) 100 MG capsule Take 1 capsule (100 mg total) by mouth 2 (two) times daily for 7 days. 14 capsule Eusebio Friendly B, PA-C   predniSONE (DELTASONE) 20 MG tablet Take 2 tablets (40 mg total) by mouth daily for 5 days. 10 tablet Gareth Morgan      PDMP not reviewed this encounter.   Shirlee Latch, PA-C 04/18/23 1655

## 2023-04-18 NOTE — ED Triage Notes (Signed)
Pt presents with a cough, SOB, wheezing and congestion x 2 days. Pt has tried OTC cold medication.

## 2023-04-18 NOTE — Discharge Instructions (Addendum)
-  Negative COVID test -I sent antibiotics, steroids and cough meds. Use inhaler as needed for shortness of breath -Return if fever, worsening cough or worsening shortness of breath

## 2023-05-25 ENCOUNTER — Other Ambulatory Visit (HOSPITAL_COMMUNITY)
Admission: RE | Admit: 2023-05-25 | Discharge: 2023-05-25 | Disposition: A | Discharge status: Home / Self Care | Payer: Medicaid Other | Source: Ambulatory Visit | Attending: Obstetrics and Gynecology | Admitting: Obstetrics and Gynecology

## 2023-05-25 ENCOUNTER — Encounter: Payer: Self-pay | Admitting: Obstetrics and Gynecology

## 2023-05-25 ENCOUNTER — Ambulatory Visit: Payer: Medicaid Other | Admitting: Obstetrics and Gynecology

## 2023-05-25 VITALS — BP 145/84 | HR 54 | Wt 210.4 lb

## 2023-05-25 DIAGNOSIS — Z124 Encounter for screening for malignant neoplasm of cervix: Secondary | ICD-10-CM

## 2023-05-25 DIAGNOSIS — Z202 Contact with and (suspected) exposure to infections with a predominantly sexual mode of transmission: Secondary | ICD-10-CM | POA: Insufficient documentation

## 2023-05-25 DIAGNOSIS — I10 Essential (primary) hypertension: Secondary | ICD-10-CM | POA: Diagnosis not present

## 2023-05-25 DIAGNOSIS — Z01419 Encounter for gynecological examination (general) (routine) without abnormal findings: Secondary | ICD-10-CM | POA: Diagnosis not present

## 2023-05-25 DIAGNOSIS — N750 Cyst of Bartholin's gland: Secondary | ICD-10-CM

## 2023-05-25 DIAGNOSIS — Z7689 Persons encountering health services in other specified circumstances: Secondary | ICD-10-CM | POA: Diagnosis not present

## 2023-05-25 DIAGNOSIS — N926 Irregular menstruation, unspecified: Secondary | ICD-10-CM

## 2023-05-25 HISTORY — DX: Cyst of Bartholin's gland: N75.0

## 2023-05-25 NOTE — Progress Notes (Signed)
Obstetrics and Gynecology New Patient Evaluation  Appointment Date: 05/25/2023  OBGYN Clinic: [facility]  Primary Care Provider: None  Referring Provider: Self  Chief Complaint: re-establish GYN care.   History of Present Illness: Hannah Hamilton is a 42 y.o.  [id number] (No LMP recorded.), seen for the above chief complaint. Her past medical history is significant for h/o c-sections with BTL, h/o HTN  Patient had right sided bartholin's in late sept and had drainage at [facility] ED; last time this happened was over 10 years ago and was on the other side. No s/s since then. Patient here to re-establish care, last seen after her last pregnancy.  Period is about 4 days late  Review of Systems: Pertinent items noted in HPI and remainder of comprehensive ROS otherwise negative.   Past Medical History:  Past Medical History:  Diagnosis Date   Bartholin cyst 05/25/2023   Depression 03/30/2019   Preeclampsia in postpartum period 03/29/2019   Pregnancy induced hypertension    Severe preeclampsia, postpartum condition 03/28/2019    Past Surgical History:  Past Surgical History:  Procedure Laterality Date   BARTHOLIN GLAND CYST EXCISION     CESAREAN SECTION     CESAREAN SECTION WITH BILATERAL TUBAL LIGATION Bilateral 03/18/2019   Procedure: CESAREAN SECTION WITH BILATERAL TUBAL LIGATION;  Surgeon: Reva Bores, MD;  Location: [facility];  Service: Obstetrics;  Laterality: Bilateral;   TONSILLECTOMY      Past Obstetrical History:  OB History  Gravida Para Term Preterm AB Living  3 3 3   3   SAB IAB Ectopic Multiple Live Births     0 3    # Outcome Date GA Lbr Len/2nd Weight Sex Type Anes PTL Lv  3 Term 03/18/19 [redacted]w[redacted]d  7 lb 8.5 oz (3.415 kg) F CS-LTranv Spinal  LIV  2 Term 10/29/12    F CS-LTranv   LIV     Birth Comments:  pp pre eclampsia     Complications: Preeclampsia in postpartum period  1 Term 03/02/11    F CS-LTranv   LIV     Complications: Failure  to Progress in First Stage    Past Gynecological History: As per HPI. Periods: qmonth, regular, approx 1 week but are heavier and more painful since her BTL with her last c-section History of Pap Smear(s): Yes.   Last pap 2020, which was wnl She is currently using bilateral tubal ligation for contraception.   Social History:  Social History   Socioeconomic History   Marital status: Single    Spouse name: Not on file   Number of children: Not on file   Years of education: Not on file   Highest education level: Not on file  Occupational History   Not on file  Tobacco Use   Smoking status: Every Day    Current packs/day: 0.50    Types: Cigarettes   Smokeless tobacco: Never  Vaping Use   Vaping status: Never Used  Substance and Sexual Activity   Alcohol use: Yes    Comment: occasionally   Drug use: No   Sexual activity: Yes    Birth control/protection: None  Other Topics Concern   Not on file  Social History Narrative   Not on file   Social Drivers of Health   Financial Resource Strain: Not on file  Food Insecurity: Not on file  Transportation Needs: Not on file  Physical Activity: Not on file  Stress: Not on file  Social Connections: Not  on file  Intimate Partner Violence: Not on file    Family History:  Family History  Problem Relation Age of Onset   Atrial fibrillation Mother    Heart disease Mother   Maternal aunt on dad's side with breast cancer in her 56s  Health Maintenance:  Mammogram(s): No.   Medications None  Allergies Patient has no known allergies.   Physical Exam:  BP (!) 145/84   Pulse (!) 54   Wt 210 lb 6.4 oz (95.4 kg)   BMI 36.12 kg/m  Body mass index is 36.12 kg/m. General appearance: Well nourished, well developed female in no acute distress.  Neck:  Supple, normal appearance, and no thyromegaly  Cardiovascular: normal s1 and s2.  No murmurs, rubs or gallops. Respiratory:  Clear to auscultation bilateral. Normal respiratory  effort Abdomen: positive bowel sounds and no masses, hernias; diffusely non tender to palpation, non distended Breasts: patient declines to have breast exam. Neuro/Psych:  Normal mood and affect.  Skin:  Warm and dry.  Lymphatic:  No inguinal lymphadenopathy.   Cervical exam performed in the presence of a chaperone Pelvic exam: is not limited by body habitus EGBUS: within normal limits Vagina: within normal limits and with no blood or discharge in the vault Cervix: normal appearing cervix without tenderness, discharge or lesions. Uterus:  nonenlarged and non tender Adnexa:  normal adnexa and no mass, fullness, tenderness Rectovaginal: deferred  Laboratory: UPT negative  Radiology: none  Assessment: patient stable  Plan:  1. Cervical cancer screening (Primary) - Cytology - PAP - Cervicovaginal ancillary only( [place])  2. Bartholin cyst Normal exam today. I told her if any prodromal s/s start up to call us and let us know asap for office evaluation. Sitz baths also d/w her - Cytology - PAP - Cervicovaginal ancillary only( [place])  3. Encounter to establish care D/w her re: mammogram screening - Cytology - PAP  4. Hypertension, unspecified type Does not have a PCP; patient has mychart. I d/w her re: how to set up appointment on [facility] homepage  5. STD exposure Declines blood screening - Cervicovaginal ancillary only( [place])  6. Late period  RTC PRN  Return in about 1 year (around 05/24/2024), or if symptoms worsen or fail to improve.  No future appointments.  Cornelia Copa MD Attending [facility])

## 2023-05-25 NOTE — Progress Notes (Signed)
New GYN:   Reoccurring Bartholin's cyst   Painful    Ok with pap today, denies any hx of abnormal    Tubal ligation- 03/18/2019 Period is about 5 days late

## 2023-05-26 LAB — CERVICOVAGINAL ANCILLARY ONLY
Chlamydia: NEGATIVE
Comment: NEGATIVE
Comment: NEGATIVE
Comment: NORMAL
Neisseria Gonorrhea: NEGATIVE
Trichomonas: NEGATIVE

## 2023-05-29 LAB — CYTOLOGY - PAP
Adequacy: ABSENT
Comment: NEGATIVE
Diagnosis: NEGATIVE
High risk HPV: NEGATIVE

## 2023-07-11 DIAGNOSIS — F411 Generalized anxiety disorder: Secondary | ICD-10-CM | POA: Diagnosis not present

## 2023-07-16 ENCOUNTER — Emergency Department
Admission: EM | Admit: 2023-07-16 | Discharge: 2023-07-16 | Disposition: A | Discharge status: Home / Self Care | Payer: Medicaid Other | Attending: Emergency Medicine | Admitting: Emergency Medicine

## 2023-07-16 ENCOUNTER — Emergency Department: Payer: Medicaid Other

## 2023-07-16 ENCOUNTER — Other Ambulatory Visit: Payer: Self-pay

## 2023-07-16 DIAGNOSIS — R1030 Lower abdominal pain, unspecified: Secondary | ICD-10-CM | POA: Diagnosis present

## 2023-07-16 DIAGNOSIS — N751 Abscess of Bartholin's gland: Secondary | ICD-10-CM | POA: Diagnosis not present

## 2023-07-16 LAB — URINALYSIS, ROUTINE W REFLEX MICROSCOPIC
Bilirubin Urine: NEGATIVE
Glucose, UA: NEGATIVE mg/dL
Ketones, ur: NEGATIVE mg/dL
Leukocytes,Ua: NEGATIVE
Nitrite: NEGATIVE
Protein, ur: 30 mg/dL — AB
RBC / HPF: >50 RBC/hpf (ref 0–5)
Specific Gravity, Urine: 1.018 (ref 1.005–1.030)
pH: 5 (ref 5.0–8.0)

## 2023-07-16 LAB — CBC
HCT: 42.5 % (ref 36.0–46.0)
Hemoglobin: 14.9 g/dL (ref 12.0–15.0)
MCH: 31.2 pg (ref 26.0–34.0)
MCHC: 35.1 g/dL (ref 30.0–36.0)
MCV: 88.9 fL (ref 80.0–100.0)
Platelets: 306 K/uL (ref 150–400)
RBC: 4.78 MIL/uL (ref 3.87–5.11)
RDW: 12.1 % (ref 11.5–15.5)
WBC: 8.3 K/uL (ref 4.0–10.5)
nRBC: 0 % (ref 0.0–0.2)

## 2023-07-16 LAB — COMPREHENSIVE METABOLIC PANEL
ALT: 21 U/L (ref 0–44)
AST: 23 U/L (ref 15–41)
Albumin: 4.2 g/dL (ref 3.5–5.0)
Alkaline Phosphatase: 59 U/L (ref 38–126)
Anion gap: 9 (ref 5–15)
BUN: 6 mg/dL (ref 6–20)
CO2: 22 mmol/L (ref 22–32)
Calcium: 9.3 mg/dL (ref 8.9–10.3)
Chloride: 108 mmol/L (ref 98–111)
Creatinine, Ser: 0.6 mg/dL (ref 0.44–1.00)
GFR, Estimated: >60 mL/min (ref >60)
Glucose, Bld: 112 mg/dL — ABNORMAL HIGH (ref 70–99)
Potassium: 3.6 mmol/L (ref 3.5–5.1)
Sodium: 139 mmol/L (ref 135–145)
Total Bilirubin: 0.6 mg/dL (ref 0.0–1.2)
Total Protein: 7.3 g/dL (ref 6.5–8.1)

## 2023-07-16 LAB — LIPASE, BLOOD: Lipase: 28 U/L (ref 11–51)

## 2023-07-16 LAB — POC URINE PREG, ED: Preg Test, Ur: NEGATIVE

## 2023-07-16 MED ORDER — SODIUM CHLORIDE 0.9 % IV BOLUS
500.0000 mL | Freq: Once | INTRAVENOUS | Status: AC
  Administered 2023-07-16: 500 mL via INTRAVENOUS

## 2023-07-16 MED ORDER — SULFAMETHOXAZOLE-TRIMETHOPRIM 800-160 MG PO TABS
1.0000 | ORAL_TABLET | Freq: Once | ORAL | Status: AC
  Administered 2023-07-16: 1 via ORAL
  Filled 2023-07-16: qty 1

## 2023-07-16 MED ORDER — IOHEXOL 300 MG/ML  SOLN
80.0000 mL | Freq: Once | INTRAMUSCULAR | Status: AC | PRN
  Administered 2023-07-16: 80 mL via INTRAVENOUS

## 2023-07-16 MED ORDER — KETOROLAC TROMETHAMINE 15 MG/ML IJ SOLN
15.0000 mg | Freq: Once | INTRAMUSCULAR | Status: AC
  Administered 2023-07-16: 15 mg via INTRAVENOUS
  Filled 2023-07-16: qty 1

## 2023-07-16 MED ORDER — SULFAMETHOXAZOLE-TRIMETHOPRIM 800-160 MG PO TABS: 1.0000 | ORAL_TABLET | Freq: Two times a day (BID) | ORAL | 0 refills | Status: AC

## 2023-07-16 NOTE — ED Provider Notes (Signed)
[facility] Provider Note    Event Date/Time   First MD Initiated Contact with Patient 07/16/23 1420     (approximate)   History   Abdominal Pain   HPI Hannah Hamilton is a 43 y.o. female presenting today for abdominal pain.  Patient states earlier this morning she had onset of sharp lower abdominal pain.  It has been intermittent but severe when it is present.  She otherwise denies fever, chills, nausea, vomiting, diarrhea, constipation, dysuria.  Has noted darker colored urine.  Has not taken any medications for her symptoms.     Physical Exam   Triage Vital Signs: ED Triage Vitals [07/16/23 1158]  Encounter Vitals Group     BP (!) 140/105     Systolic BP Percentile      Diastolic BP Percentile      Pulse Rate (!) 102     Resp 18     Temp 98 F (36.7 C)     Temp src      SpO2 97 %     Weight 205 lb (93 kg)     Height 5\' 4"  (1.626 m)     Head Circumference      Peak Flow      Pain Score 9     Pain Loc      Pain Education      Exclude from Growth Chart     Most recent vital signs: Vitals:   07/16/23 1824 07/16/23 1830  BP:    Pulse:  67  Resp:    Temp: 98 F (36.7 C)   SpO2:  99%   Physical Exam: I have reviewed the vital signs and nursing notes. General: Awake, alert, no acute distress.  Nontoxic appearing. Head:  Atraumatic, normocephalic.   ENT:  EOM intact, PERRL. Oral mucosa is pink and moist with no lesions. Neck: Neck is supple with full range of motion, No meningeal signs. Cardiovascular:  RRR, No murmurs. Peripheral pulses palpable and equal bilaterally. Respiratory:  Symmetrical chest wall expansion.  No rhonchi, rales, or wheezes.  Good air movement throughout.  No use of accessory muscles.   Musculoskeletal:  No cyanosis or edema. Moving extremities with full ROM Abdomen:  Soft, sharp tenderness to palpation in suprapubic region with mild tenderness to palpation in bilateral lower quadrants, nondistended. Neuro:  GCS 15,  moving all four extremities, interacting appropriately. Speech clear. Psych:  Calm, appropriate.   Skin:  Warm, dry, no rash.    ED Results / Procedures / Treatments   Labs (all labs ordered are listed, but only abnormal results are displayed) Labs Reviewed  COMPREHENSIVE METABOLIC PANEL - Abnormal; Notable for the following components:      Result Value   Glucose, Bld 112 (*)    All other components within normal limits  URINALYSIS, ROUTINE W REFLEX MICROSCOPIC - Abnormal; Notable for the following components:   Color, Urine YELLOW (*)    APPearance HAZY (*)    Hgb urine dipstick LARGE (*)    Protein, ur 30 (*)    Bacteria, UA RARE (*)    All other components within normal limits  LIPASE, BLOOD  CBC  POC URINE PREG, ED     EKG    RADIOLOGY Independently interpreted CT abdomen/pelvis with evidence of possible Bartholin gland cyst as well as possible abscess.  Also incidental finding of lesion on the liver.   PROCEDURES:  Critical Care performed: No  Procedures   MEDICATIONS ORDERED IN ED: Medications  sulfamethoxazole-trimethoprim (BACTRIM DS) 800-160 MG per tablet 1 tablet (has no administration in time range)  sodium chloride 0.9 % bolus 500 mL (0 mLs Intravenous Stopped 07/16/23 1824)  ketorolac (TORADOL) 15 MG/ML injection 15 mg (15 mg Intravenous Given 07/16/23 1508)  iohexol (OMNIPAQUE) 300 MG/ML solution 80 mL (80 mLs Intravenous Contrast Given 07/16/23 1524)     IMPRESSION / MDM / ASSESSMENT AND PLAN / ED COURSE  I reviewed the triage vital signs and the nursing notes.                              Differential diagnosis includes, but is not limited to, acute cystitis, enteritis, colitis, nephrolithiasis, pyelonephritis, lower concern for ovarian pathology  Patient's presentation is most consistent with acute complicated illness / injury requiring diagnostic workup.  Patient is a 43 year old female presenting today for sharp lower abdominal pain.  No  other associated symptoms.  Does have notable tenderness palpation in the lower abdomen bilaterally as well as suprapubic region.  No rebounding or guarding.  Slight tachycardia but otherwise vital signs stable.  Laboratory workup with normal CBC and CMP.  Normal lipase.  UA has large amount of blood but no obvious indication of a UTI.  Will get CT abdomen/pelvis for further evaluation of possible kidney stone or other intra-abdominal infection.  Patient given fluids and Toradol for symptomatic management.  CT abdomen/pelvis shows evidence of possible Bartholin gland cyst as well as abscess.  Reassessed patient with minimal pain complaints.  She denied any pain to her external genitalia.  Pelvic exam was performed which shows no tenderness palpation or fluctuance indicative of Bartholin gland cyst.  No evidence of incision and drainage.  Discussed case with gynecology given deeper abscess.  They did not recommend any additional drainage at this time.  Recommend starting oral antibiotics and follow-up outpatient in the gynecology clinic.  Patient started on Bactrim and agreeable with this plan.  Clinical Course as of 07/16/23 1856  Wynelle Link Jul 16, 2023  1825 CT ABDOMEN PELVIS W CONTRAST Pelvic exam to evaluate for potential Bartholin cyst showed no tenderness to palpation or obvious fluctuance anywhere in the external pelvic genitalia.  Will consult gynecology for further evaluation. [DW]    Clinical Course User Index [DW] Janith Lima, MD     FINAL CLINICAL IMPRESSION(S) / ED DIAGNOSES   Final diagnoses:  Bartholin's gland abscess     Rx / DC Orders   ED Discharge Orders          Ordered    sulfamethoxazole-trimethoprim (BACTRIM DS) 800-160 MG tablet  2 times daily        07/16/23 1855    Ambulatory referral to Gynecology        07/16/23 1856             Note:  This document was prepared using Dragon voice recognition software and may include unintentional dictation errors.    Janith Lima, MD 07/16/23 [phone]

## 2023-07-16 NOTE — ED Triage Notes (Signed)
Pt comes with c/o lower pelvic pain that started this morning. Pt states no nausea or vomiting. Pt states no urinary symptoms.

## 2023-07-16 NOTE — Discharge Instructions (Addendum)
Please follow-up with gynecology outpatient for reassessment within the next week to ensure improvement in infection and symptoms.  Please return for any severe worsening symptoms.  There was an incidental finding of a spot on your liver seen on CT.  The radiologist recommends you follow-up with your primary care provider to get an outpatient MRI of your liver.  No further workup was needed for that at this time today.

## 2023-07-19 ENCOUNTER — Encounter: Payer: Self-pay | Admitting: General Practice

## 2023-07-19 ENCOUNTER — Telehealth (INDEPENDENT_AMBULATORY_CARE_PROVIDER_SITE_OTHER): Payer: Medicaid Other | Admitting: General Practice

## 2023-07-19 VITALS — Ht 64.0 in | Wt 205.0 lb

## 2023-07-19 DIAGNOSIS — K769 Liver disease, unspecified: Secondary | ICD-10-CM | POA: Diagnosis not present

## 2023-07-19 DIAGNOSIS — I10 Essential (primary) hypertension: Secondary | ICD-10-CM | POA: Diagnosis not present

## 2023-07-19 DIAGNOSIS — Z7689 Persons encountering health services in other specified circumstances: Secondary | ICD-10-CM

## 2023-07-19 NOTE — Assessment & Plan Note (Signed)
Unclear etiology.  Unable to do exam due to video visit.  No symptoms. No known history of alcohol abuse. Denies any red flag symptoms.  Reviewed labs, notes and imaging from the ER.  Liver functions within normal limits.   Referral placed for MRI and GI.

## 2023-07-19 NOTE — Progress Notes (Signed)
Patient ID: Hannah Hamilton, female    DOB: Aug 14, 1980, 43 y.o.   MRN: [id number]  Virtual visit completed through [employer], a video enabled telemedicine application. Due to national recommendations of social distancing due to COVID-19, a virtual visit is felt to be most appropriate for this patient at this time. Reviewed limitations, risks, security and privacy concerns of performing a virtual visit and the availability of in person appointments. I also reviewed that there may be a patient responsible charge related to this service. The patient agreed to proceed.   Patient location: home Provider location: Provider home office. Persons participating in this virtual visit: patient, provider   If any vitals were documented, they were collected by patient at home unless specified below.    Ht 5\' 4"  (1.626 m)   Wt 205 lb (93 kg)   LMP 07/16/2023   BMI 35.19 kg/m    Subjective:   HPI: Hannah Hamilton is a 43 y.o. female presenting on 07/19/2023 for New Patient (Initial Visit) and lesion (CT found lesion on liver needs f/u)  Last PCP/physical/labs: several years ago.  Lesion on liver: patient went to the ER on Sunday 07/16/23 for lower abdominal pain. She had a CT scan which showed at least 3 hypoattenuating well-circumscribed structures in the inferior vagina/labia on the right side measuring up to 1.1x2.1 cm which they think might be Bartholin cyst. No acute inflammatory process within the abdomen or pelvis. No nephroureterolithaisis or obstructive uropathy. There was an incidental finding of a 9x13 mm hypoattenuating lesion in the right hepatic lobe, further evaluation with non emergent MRI abdomen as per liver mass protocol was recommended. She was referred to PCP and GYN.   Today she reports that she still has some lower abdominal pain but overall symptoms are improving. She has been taking her Bactrim as prescribed. She did experience some diarrhea yesterday and vomiting. But that has now  resolved. She does report that her cycles have been irregular for the past five months and she has seen her GYN in December. She had a pap smear done in December and it was normal. Cycles are not heavier but more irregular and painful. She is not currently on oral contraceptives.   Hypertension: she was found to be hypertensive at the hospital. She has not been checking her BP at home. She denies any headaches, blurred vision, chest pain or shortness of breath.    Relevant past medical, surgical, family and social history reviewed and updated as indicated. Interim medical history since our last visit reviewed. Allergies and medications reviewed and updated. Outpatient Medications Prior to Visit  Medication Sig Dispense Refill   sulfamethoxazole-trimethoprim (BACTRIM DS) 800-160 MG tablet Take 1 tablet by mouth 2 (two) times daily for 7 days. 14 tablet 0   HYDROcodone-acetaminophen (NORCO/VICODIN) 5-325 MG tablet Take 1 tablet by mouth every 4 (four) hours as needed for moderate pain. (Patient not taking: Reported on 05/25/2023) 20 tablet 0   ibuprofen (ADVIL) 800 MG tablet Take 1 tablet (800 mg total) by mouth 3 (three) times daily with meals. (Patient not taking: Reported on 05/25/2023) 21 tablet 0   promethazine-dextromethorphan (PROMETHAZINE-DM) 6.25-15 MG/5ML syrup Take 5 mLs by mouth 4 (four) times daily as needed. (Patient not taking: Reported on 05/25/2023) 118 mL 0   No facility-administered medications prior to visit.     Per HPI unless specifically indicated in ROS section below Review of Systems  Constitutional:  Negative for chills and fever.  Respiratory:  Negative for shortness  of breath.   Cardiovascular:  Negative for chest pain.  Gastrointestinal:  Positive for abdominal pain and diarrhea. Negative for blood in stool, constipation, nausea and vomiting.  Neurological:  Negative for dizziness and headaches.  Psychiatric/Behavioral:  Negative for suicidal ideas. The patient is  not nervous/anxious.    Objective:  Ht 5\' 4"  (1.626 m)   Wt 205 lb (93 kg)   LMP 07/16/2023   BMI 35.19 kg/m   Wt Readings from Last 3 Encounters:  07/19/23 205 lb (93 kg)  07/16/23 205 lb (93 kg)  05/25/23 210 lb 6.4 oz (95.4 kg)       Physical exam: General: Alert and oriented x 3, no distress, does not appear sickly  Pulmonary: Speaks in complete sentences without increased work of breathing, no cough during visit.  Psychiatric: Normal mood, thought content, and behavior.     Results for orders placed or performed during the hospital encounter of 07/16/23  Lipase, blood   Collection Time: 07/16/23 12:00 PM  Result Value Ref Range   Lipase 28 11 - 51 U/L  Comprehensive metabolic panel   Collection Time: 07/16/23 12:00 PM  Result Value Ref Range   Sodium 139 135 - 145 mmol/L   Potassium 3.6 3.5 - 5.1 mmol/L   Chloride 108 98 - 111 mmol/L   CO2 22 22 - 32 mmol/L   Glucose, Bld 112 (H) 70 - 99 mg/dL   BUN 6 6 - 20 mg/dL   Creatinine, Ser 4.09 0.44 - 1.00 mg/dL   Calcium 9.3 8.9 - 81.1 mg/dL   Total Protein 7.3 6.5 - 8.1 g/dL   Albumin 4.2 3.5 - 5.0 g/dL   AST 23 15 - 41 U/L   ALT 21 0 - 44 U/L   Alkaline Phosphatase 59 38 - 126 U/L   Total Bilirubin 0.6 0.0 - 1.2 mg/dL   GFR, Estimated >91 >47 mL/min   Anion gap 9 5 - 15  CBC   Collection Time: 07/16/23 12:00 PM  Result Value Ref Range   WBC 8.3 4.0 - 10.5 K/uL   RBC 4.78 3.87 - 5.11 MIL/uL   Hemoglobin 14.9 12.0 - 15.0 g/dL   HCT 82.9 56.2 - 13.0 %   MCV 88.9 80.0 - 100.0 fL   MCH 31.2 26.0 - 34.0 pg   MCHC 35.1 30.0 - 36.0 g/dL   RDW 86.5 78.4 - 69.6 %   Platelets 306 150 - 400 K/uL   nRBC 0.0 0.0 - 0.2 %  Urinalysis, Routine w reflex microscopic -Urine, Random   Collection Time: 07/16/23 12:00 PM  Result Value Ref Range   Color, Urine YELLOW (A) YELLOW   APPearance HAZY (A) CLEAR   Specific Gravity, Urine 1.018 1.005 - 1.030   pH 5.0 5.0 - 8.0   Glucose, UA NEGATIVE NEGATIVE mg/dL   Hgb urine  dipstick LARGE (A) NEGATIVE   Bilirubin Urine NEGATIVE NEGATIVE   Ketones, ur NEGATIVE NEGATIVE mg/dL   Protein, ur 30 (A) NEGATIVE mg/dL   Nitrite NEGATIVE NEGATIVE   Leukocytes,Ua NEGATIVE NEGATIVE   RBC / HPF >50 0 - 5 RBC/hpf   WBC, UA 6-10 0 - 5 WBC/hpf   Bacteria, UA RARE (A) NONE SEEN   Squamous Epithelial / HPF 0-5 0 - 5 /HPF   Mucus PRESENT   POC urine preg, ED (not at [facility])   Collection Time: 07/16/23 12:08 PM  Result Value Ref Range   Preg Test, Ur NEGATIVE NEGATIVE   Assessment & Plan:  Problem List Items Addressed This Visit       Cardiovascular and Mediastinum   Hypertension   Elevated readings at the hospital.   Patient will get a BP cutt and start measuring BP at home and will bring the log to the office in two weeks.   She will report any readings consistently above 140/90 and/or lower than 100/60.  Discussed ER precautions.   Follow up in two weeks.  Discussed lifestyle modifications with low sodium diet and exercise.        Digestive   Lesion of liver - Primary   Unclear etiology.  Unable to do exam due to video visit.  No symptoms. No known history of alcohol abuse. Denies any red flag symptoms.  Reviewed labs, notes and imaging from the ER.  Liver functions within normal limits.   Referral placed for MRI and GI.       Relevant Orders   MR Abdomen W Wo Contrast   Ambulatory referral to Gastroenterology     Other   Obesity, morbid (HCC)   Discussed life style modifications and exercise.      Encounter to establish care with new doctor   EMR reviewed briefly.        No orders of the defined types were placed in this encounter.  Orders Placed This Encounter  Procedures   MR Abdomen W Wo Contrast    43 year old female with incidental find of right hepatic lobe liver lesion. Needs MRI of abdomen to follow liver mass protocol.    Standing Status:   Future    Expiration Date:   07/18/2024    If indicated for the ordered procedure, I  authorize the administration of contrast media per Radiology protocol:   Yes    What is the patient's sedation requirement?:   No Sedation    Does the patient have a pacemaker or implanted devices?:   No    Preferred imaging location?:   [facility] (table limit - 550lbs)   Ambulatory referral to Gastroenterology    Referral Priority:   Urgent    Referral Type:   Consultation    Referral Reason:   Specialty Services Required    Number of Visits Requested:   1    I discussed the assessment and treatment plan with the patient. The patient was provided an opportunity to ask questions and all were answered. The patient agreed with the plan and demonstrated an understanding of the instructions. The patient was advised to call back or seek an in-person evaluation if the symptoms worsen or if the condition fails to improve as anticipated.  Follow up plan:  Return in about 2 weeks (around 08/02/2023) for Blood pressure.  Modesto Charon, NP

## 2023-07-19 NOTE — Patient Instructions (Addendum)
Patient is asked to monitor BP at home or work, several times for two weeks and send readings via mychart. Goal BP 120/80 Report readings above 140/90 and/or lower than 100/60.   You will either be contacted via phone regarding your referral to gastroenterology and MRI , or you may receive a letter on your MyChart portal from our referral team with instructions for scheduling an appointment. Please let us know if you have not been contacted by anyone within two weeks.  Discuss irregular periods with GYN.

## 2023-07-19 NOTE — Assessment & Plan Note (Signed)
 EMR reviewed briefly.

## 2023-07-19 NOTE — Assessment & Plan Note (Signed)
Discussed life style modifications and exercise.

## 2023-07-19 NOTE — Assessment & Plan Note (Addendum)
 Elevated readings at the hospital.   Patient will get a BP cuff and start measuring BP at home and will bring the log to the office in two weeks.   She will report any readings consistently above 140/90 and/or lower than 100/60.  Discussed ER precautions.   Follow up in two weeks.  Discussed lifestyle modifications with low sodium diet and exercise.

## 2023-08-03 ENCOUNTER — Encounter: Payer: Self-pay | Admitting: *Deleted

## 2023-08-03 ENCOUNTER — Ambulatory Visit: Payer: Medicaid Other | Admitting: Obstetrics and Gynecology

## 2023-08-03 VITALS — BP 135/87 | HR 67 | Wt 213.0 lb

## 2023-08-03 DIAGNOSIS — N898 Other specified noninflammatory disorders of vagina: Secondary | ICD-10-CM

## 2023-08-03 NOTE — Progress Notes (Signed)
 Obstetrics and Gynecology Visit Return Patient Evaluation  Appointment Date: 08/03/2023  Primary Care Provider: Modesto Charon  OBGYN Clinic: [facility]  Chief Complaint: ED follow up  History of Present Illness:  Hannah Hamilton is a 43 y.o. seen on 2/16 at [facility] for lower abdominal pain that she states started the day she presented and no prior s/s. CT done and showed right labial cysts 1-2cm in the area of right bartholin. Labs and exam negative at that workup  Interval History: Since that time, she states that s/s stopped after her period ended which was 4 days after going to the ED. No bartholin's issues since I saw her on late December 2024 at her establishment of care visit  Review of Systems: as noted in the History of Present Illness.  Patient Active Problem List   Diagnosis Date Noted   Obesity, morbid (HCC) 07/19/2023   Lesion of liver 07/19/2023   Encounter to establish care with new doctor 07/19/2023   Hypertension 05/25/2023   Medications:  [facility] had no medications administered during this visit. No current outpatient medications on file.   No current facility-administered medications for this visit.    Allergies: has no known allergies.  Physical Exam:  BP 135/87   Pulse 67   Wt 213 lb (96.6 kg)   LMP 07/16/2023   BMI 36.56 kg/m  Body mass index is 36.56 kg/m. General appearance: Well nourished, well developed female in no acute distress.  Abdomen: diffusely non tender to palpation, non distended, and no masses, hernias Neuro/Psych:  Normal mood and affect.    Pelvic exam:  Cervical exam performed in the presence of a chaperone EGBUS: wnl. ?pea sized nttp, mobile area on palpation in the area of the right bartholin's gland; left side benign. No inguinal LAD   Assessment: patient stable  Plan:  1. 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 Future bartholin's s/s d/w her like in  prior visit  RTC PRN   Cornelia Copa MD Attending Center for [facility])

## 2023-08-03 NOTE — Progress Notes (Signed)
 Pt here for F/U ED visit and CT on 07/16/23 pt denies any pain today.  Last pap : 05/25/23 WNL

## 2023-08-04 NOTE — Telephone Encounter (Signed)
 Copied from CRM [id number]. Topic: General - Other >> Aug 04, 2023 11:59 AM Eunice Blase wrote: Reason for CRM: Received call from [facility] MRI per [facility] [phone] ext. [phone]  or fax:[phone] needs prior authorization. Pt is scheduled for August 10, 2023. Please call or fax.

## 2023-08-04 NOTE — Telephone Encounter (Signed)
 See referral order notes for updates. Auth in progress

## 2023-08-10 ENCOUNTER — Ambulatory Visit: Admission: RE | Admit: 2023-08-10 | Source: Ambulatory Visit

## 2023-08-13 ENCOUNTER — Ambulatory Visit
Admission: RE | Admit: 2023-08-13 | Discharge: 2023-08-13 | Disposition: A | Discharge status: Home / Self Care | Source: Ambulatory Visit | Attending: General Practice | Admitting: General Practice

## 2023-08-13 DIAGNOSIS — D1803 Hemangioma of intra-abdominal structures: Secondary | ICD-10-CM | POA: Diagnosis not present

## 2023-08-13 DIAGNOSIS — K769 Liver disease, unspecified: Secondary | ICD-10-CM | POA: Insufficient documentation

## 2023-08-13 DIAGNOSIS — R160 Hepatomegaly, not elsewhere classified: Secondary | ICD-10-CM | POA: Diagnosis not present

## 2023-08-13 MED ORDER — GADOBUTROL 1 MMOL/ML IV SOLN
10.0000 mL | Freq: Once | INTRAVENOUS | Status: AC | PRN
  Administered 2023-08-13: 10 mL via INTRAVENOUS

## 2023-08-15 ENCOUNTER — Encounter: Payer: Self-pay | Admitting: General Practice

## 2023-08-22 ENCOUNTER — Ambulatory Visit: Admitting: Gastroenterology

## 2023-08-22 ENCOUNTER — Encounter: Payer: Self-pay | Admitting: Gastroenterology

## 2023-08-22 VITALS — BP 116/76 | HR 77 | Temp 97.8°F | Ht 64.0 in | Wt 212.2 lb

## 2023-08-22 DIAGNOSIS — K760 Fatty (change of) liver, not elsewhere classified: Secondary | ICD-10-CM | POA: Diagnosis not present

## 2023-08-22 DIAGNOSIS — D1803 Hemangioma of intra-abdominal structures: Secondary | ICD-10-CM

## 2023-08-22 NOTE — Progress Notes (Signed)
 Arlyss Repress, MD [address]  [address]  [place], [place] [address]  Main: [phone]  Fax: [phone]    Gastroenterology Consultation  Referring Provider:     Modesto Charon, NP Primary Care Physician:  Modesto Charon, NP Primary Gastroenterologist:  Dr. Arlyss Repress Reason for Consultation: Liver lesion        HPI:   Hannah Hamilton is a 43 y.o. female referred by Modesto Charon, NP  for consultation & management of incidental finding of hemangioma of the liver.  Patient went to the ER on 07/16/2023 secondary to pelvic pain, underwent CT abdomen pelvis with contrast which revealed hepatic steatosis and 9 x 13 mm hypoattenuating lesion in the right hepatic lobe.  Subsequently, underwent MR abdomen liver mass protocol a month later, which confirmed hemangioma of the liver.  Patient does not have any right upper quadrant symptoms.  Her LFTs have been normal.  She does not have any other GI symptoms.  She does admit to drinking sodas daily  NSAIDs: None  Antiplts/Anticoagulants/Anti thrombotics: None  GI Procedures: None She denies any family history of chronic liver disease or GI malignancy  Past Medical History:  Diagnosis Date   Bartholin cyst 05/25/2023   Depression 03/30/2019   Preeclampsia in postpartum period 03/29/2019   Pregnancy induced hypertension    Severe preeclampsia, postpartum condition 03/28/2019    Past Surgical History:  Procedure Laterality Date   BARTHOLIN GLAND CYST EXCISION     CESAREAN SECTION     CESAREAN SECTION WITH BILATERAL TUBAL LIGATION Bilateral 03/18/2019   Procedure: CESAREAN SECTION WITH BILATERAL TUBAL LIGATION;  Surgeon: Reva Bores, MD;  Location: [facility] LD ORS;  Service: Obstetrics;  Laterality: Bilateral;   TONSILLECTOMY      No current outpatient medications on file.   Family History  Problem Relation Age of Onset   Atrial fibrillation Mother    Heart disease Mother      Social History   Tobacco Use   Smoking  status: Every Day    Current packs/day: 0.50    Types: Cigarettes   Smokeless tobacco: Never  Vaping Use   Vaping status: Never Used  Substance Use Topics   Alcohol use: Yes    Comment: occasionally   Drug use: No    Allergies as of 08/22/2023   (No Known Allergies)    Review of Systems:    All systems reviewed and negative except where noted in HPI.   Physical Exam:  BP 116/76 (BP Location: Left Arm, Patient Position: Sitting, Cuff Size: Normal)   Pulse 77   Temp 97.8 F (36.6 C) (Oral)   Ht 5\' 4"  (1.626 m)   Wt 212 lb 4 oz (96.3 kg)   BMI 36.43 kg/m  No LMP recorded.  General:   Alert,  Well-developed, well-nourished, pleasant and cooperative in NAD Head:  Normocephalic and atraumatic. Eyes:  Sclera clear, no icterus.   Conjunctiva pink. Ears:  Normal auditory acuity. Nose:  No deformity, discharge, or lesions. Mouth:  No deformity or lesions,oropharynx pink & moist. Neck:  Supple; no masses or thyromegaly. Lungs:  Respirations even and unlabored.  Clear throughout to auscultation.   No wheezes, crackles, or rhonchi. No acute distress. Heart:  Regular rate and rhythm; no murmurs, clicks, rubs, or gallops. Abdomen:  Normal bowel sounds. Soft, non-tender and non-distended without masses, hepatosplenomegaly or hernias noted.  No guarding or rebound tenderness.   Rectal: Not performed Msk:  Symmetrical without gross deformities. Good, equal  movement & strength bilaterally. Pulses:  Normal pulses noted. Extremities:  No clubbing or edema.  No cyanosis. Neurologic:  Alert and oriented x3;  grossly normal neurologically. Skin:  Intact without significant lesions or rashes. No jaundice. Psych:  Alert and cooperative. Normal mood and affect.  Imaging Studies: Reviewed  Assessment and Plan:   Hannah Hamilton is a 43 y.o. female with obesity is seen in consultation for hepatic hemangioma and fatty liver.  No further workup is indicated at this time Discussed in length  regarding fatty liver, its pathogenesis, provided counseling regarding management of fatty liver, lifestyle modification, weight loss to prevent progression to cirrhosis of liver   Follow up as needed   Arlyss Repress, MD

## 2023-08-23 ENCOUNTER — Ambulatory Visit: Admitting: General Practice

## 2023-08-24 ENCOUNTER — Ambulatory Visit: Admitting: General Practice

## 2023-09-04 ENCOUNTER — Ambulatory Visit: Admitting: General Practice

## 2023-09-14 ENCOUNTER — Encounter: Payer: Self-pay | Admitting: General Practice

## 2023-09-14 ENCOUNTER — Ambulatory Visit: Admitting: General Practice

## 2023-09-14 VITALS — BP 110/58 | HR 66 | Temp 99.1°F | Ht 64.0 in | Wt 216.0 lb

## 2023-09-14 DIAGNOSIS — I10 Essential (primary) hypertension: Secondary | ICD-10-CM | POA: Diagnosis not present

## 2023-09-14 DIAGNOSIS — R2242 Localized swelling, mass and lump, left lower limb: Secondary | ICD-10-CM

## 2023-09-14 NOTE — Patient Instructions (Signed)
 Please schedule follow up in four weeks for a physical.  Please do not any procedures at home on your foot.   It was a pleasure to see you today!

## 2023-09-14 NOTE — Assessment & Plan Note (Signed)
 BP at goal today.  Discussed checking blood pressure at home if symptomatic.  Will continue to monitor.  Patient advised to schedule physical and labs.

## 2023-09-14 NOTE — Progress Notes (Signed)
 Established Patient Office Visit  Subjective   Patient ID: Hannah Hamilton, female    DOB: 04-08-1981  Age: 43 y.o. MRN: [id number]  Chief Complaint  Patient presents with   Hypertension    BP had been doing better per patient.    Mass    On left foot that needs to be checked. Patient states it is painful and has been present x 1 year. Patient states bump is very hard.    HPI  Hannah Hamilton is a 43 year old female with past medical history of hypertension, morbid obesity, lesion of liver presents today for follow-up on hypertension and foot pain.  HTN: She had a MyChart visit on 07/19/2023 after her ER visit on 07/16/2023 where she was found to be hypertensive.  She was asked to follow-up regarding her hypertension.  She was advised on 07/19/2023 to start checking her blood pressure at home and send the logs via MyChart and/or bring to the office visit at the follow-up in 2 weeks.  However patient was not been able to come until today.  She reports that she has been checking her blood pressure at home and her readings have been good.  She does not remember what the exact readings were.  She denies any blurred vision, headaches, chest pain, shortness of breath or difficulty breathing.     Left foot nodule: symptom onset a few years ago. Located on the plantar area. She does have pain when walking or when she puts weight on it. She does have some itching but denies any drainage. She denies any trauma or injury.  She reports that her boyfriend used an xacto knife/blade to open the nodule and did remove some tissue.  She denies any pain or any other drainage during that time.  She denies any fever, chills, swelling, chest pain, shortness of breath or difficulty breathing.   Patient Active Problem List   Diagnosis Date Noted   Nodule of skin of left foot 09/14/2023   Obesity, morbid (HCC) 07/19/2023   Lesion of liver 07/19/2023   Encounter to establish care with new doctor 07/19/2023   Hypertension  05/25/2023   Past Medical History:  Diagnosis Date   Bartholin cyst 05/25/2023   Depression 03/30/2019   Preeclampsia in postpartum period 03/29/2019   Pregnancy induced hypertension    Severe preeclampsia, postpartum condition 03/28/2019   Past Surgical History:  Procedure Laterality Date   BARTHOLIN GLAND CYST EXCISION     CESAREAN SECTION     CESAREAN SECTION WITH BILATERAL TUBAL LIGATION Bilateral 03/18/2019   Procedure: CESAREAN SECTION WITH BILATERAL TUBAL LIGATION;  Surgeon: Reva Bores, MD;  Location: [facility] LD ORS;  Service: Obstetrics;  Laterality: Bilateral;   TONSILLECTOMY     No Known Allergies       09/14/2023   11:32 AM 07/19/2023   10:55 AM 05/25/2023    9:22 AM  Depression screen PHQ 2/9  Decreased Interest 0 0 0  Down, Depressed, Hopeless 0 0 0  PHQ - 2 Score 0 0 0  Altered sleeping 0 0 0  Tired, decreased energy 0 0 0  Change in appetite 0 0 0  Feeling bad or failure about yourself  0 0 0  Trouble concentrating 0 1 0  Moving slowly or fidgety/restless 0 0 0  Suicidal thoughts 0 0 0  PHQ-9 Score 0 1 0  Difficult doing work/chores Not difficult at all Not difficult at all Not difficult at all       09/14/2023  11:32 AM 07/19/2023   10:56 AM 05/25/2023    9:23 AM  GAD 7 : Generalized Anxiety Score  Nervous, Anxious, on Edge 0 0 0  Control/stop worrying 0 0 0  Worry too much - different things 0 0 0  Trouble relaxing 0 0 0  Restless 0 0 0  Easily annoyed or irritable 0 1 0  Afraid - awful might happen 0 0 0  Total GAD 7 Score 0 1 0  Anxiety Difficulty Not difficult at all Not difficult at all Not difficult at all      Review of Systems  Constitutional:  Negative for chills and fever.  Respiratory:  Negative for shortness of breath.   Cardiovascular:  Negative for chest pain.  Gastrointestinal:  Negative for abdominal pain, constipation, diarrhea, heartburn, nausea and vomiting.  Genitourinary:  Negative for dysuria, frequency and urgency.   Skin:        Bump on plantar surface of left foot.  Neurological:  Negative for dizziness and headaches.  Endo/Heme/Allergies:  Negative for polydipsia.  Psychiatric/Behavioral:  Negative for depression and suicidal ideas. The patient is not nervous/anxious.       Objective:     BP (!) 110/58 (BP Location: Left Arm, Patient Position: Sitting, Cuff Size: Large)   Pulse 66   Temp 99.1 F (37.3 C) (Oral)   Ht 5\' 4"  (1.626 m)   Wt 216 lb (98 kg)   SpO2 97%   BMI 37.08 kg/m  BP Readings from Last 3 Encounters:  09/14/23 (!) 110/58  08/22/23 116/76  08/03/23 135/87   Wt Readings from Last 3 Encounters:  09/14/23 216 lb (98 kg)  08/22/23 212 lb 4 oz (96.3 kg)  08/03/23 213 lb (96.6 kg)      Physical Exam Vitals and nursing note reviewed.  Constitutional:      Appearance: Normal appearance.  Cardiovascular:     Rate and Rhythm: Normal rate and regular rhythm.     Pulses: Normal pulses.     Heart sounds: Normal heart sounds.  Pulmonary:     Effort: Pulmonary effort is normal.     Breath sounds: Normal breath sounds.  Musculoskeletal:       Feet:  Feet:     Comments: Less than 4 mm bump on plantar surface of left foot. Neurological:     Mental Status: She is alert and oriented to person, place, and time.  Psychiatric:        Mood and Affect: Mood normal.        Behavior: Behavior normal.        Thought Content: Thought content normal.        Judgment: Judgment normal.      No results found for any visits on 09/14/23.     The ASCVD Risk score (Arnett DK, et al., 2019) failed to calculate for the following reasons:   Cannot find a previous HDL lab   Cannot find a previous total cholesterol lab    Assessment & Plan:  Hypertension, unspecified type Assessment & Plan: BP at goal today.  Discussed checking blood pressure at home if symptomatic.  Will continue to monitor.  Patient advised to schedule physical and labs.   Nodule of skin of left  foot Assessment & Plan: Symptoms suggestive of cyst. Exam stable today. Reviewed x-ray from August 2023 in Care Everywhere, which showed no acute fracture or malalignment, mild degenerative changes of articulations of the midfoot and small calcaneal enthesophytes.   Patient advised to  avoid I&D procedures at home. She will continue to monitor. She will schedule follow-up if her cyst increases in size or affects her quality of life.  Follow-up in 4 weeks.      Return in about 4 weeks (around 10/12/2023) for fasting labs and physical..    Jolanda Nation, NP

## 2023-09-14 NOTE — Assessment & Plan Note (Signed)
 Symptoms suggestive of cyst. Exam stable today. Reviewed x-ray from August 2023 in Care Everywhere, which showed no acute fracture or malalignment, mild degenerative changes of articulations of the midfoot and small calcaneal enthesophytes.   Patient advised to avoid I&D procedures at home. She will continue to monitor. She will schedule follow-up if her cyst increases in size or affects her quality of life.  Follow-up in 4 weeks.

## 2023-12-23 DIAGNOSIS — M25562 Pain in left knee: Secondary | ICD-10-CM | POA: Diagnosis not present

## 2024-01-05 ENCOUNTER — Telehealth: Payer: Self-pay | Admitting: General Practice

## 2024-01-05 ENCOUNTER — Encounter: Payer: Self-pay | Admitting: General Practice

## 2024-01-05 ENCOUNTER — Ambulatory Visit: Admitting: General Practice

## 2024-01-05 NOTE — Telephone Encounter (Signed)
 Warning letter sent today 8.8.25 at the provider's request:  Vincente Shivers, NP  Katrina, Amy Hi Amy,  This patient has cancelled multiple times and no showed twice. Can we send her a letter letting her know the policy and at the next show we would need to dismiss her?  Thank you so much. Jenette

## 2024-01-11 ENCOUNTER — Encounter: Payer: Self-pay | Admitting: General Practice

## 2024-01-11 ENCOUNTER — Ambulatory Visit: Admitting: General Practice

## 2024-01-11 ENCOUNTER — Ambulatory Visit
Admission: RE | Admit: 2024-01-11 | Discharge: 2024-01-11 | Disposition: A | Discharge status: Home / Self Care | Source: Ambulatory Visit | Attending: General Practice | Admitting: General Practice

## 2024-01-11 VITALS — BP 122/78 | HR 63 | Temp 98.0°F | Ht 64.0 in | Wt 207.0 lb

## 2024-01-11 DIAGNOSIS — M25512 Pain in left shoulder: Secondary | ICD-10-CM | POA: Insufficient documentation

## 2024-01-11 DIAGNOSIS — M25562 Pain in left knee: Secondary | ICD-10-CM | POA: Insufficient documentation

## 2024-01-11 DIAGNOSIS — G8929 Other chronic pain: Secondary | ICD-10-CM | POA: Diagnosis not present

## 2024-01-11 MED ORDER — TRAMADOL HCL 50 MG PO TABS: 50.0000 mg | ORAL_TABLET | Freq: Three times a day (TID) | ORAL | 0 refills | Status: AC | PRN

## 2024-01-11 NOTE — Patient Instructions (Addendum)
 Complete xray(s) prior to leaving today. I will notify you of your results once received.  Continue Naproxen, Ibuprofen  and Tylenol  as needed.  Use ice and heat as tolerated.  You will either be contacted via phone regarding your referral to orthopedics, or you may receive a letter on your MyChart portal from our referral team with instructions for scheduling an appointment. Please let us  know if you have not been contacted by anyone within two weeks.  Follow up in 2 weeks.   It was a pleasure to see you today!

## 2024-01-11 NOTE — Progress Notes (Signed)
 Established Patient Office Visit  Subjective   Patient ID: Hannah Hamilton, female    DOB: Sep 30, 1980  Age: 43 y.o. MRN: [id number]  Chief Complaint  Patient presents with   Shoulder Pain    And knee pain on left side. Patient fell getting out of the shower on her knee x few weeks ago. Patient has been taking naproxen, tylenol  and ibuprofen  for the pain but not helping. Patient was seen at [facility] after the fall and has been wearing a brace on her knee but has not gotten any better. Has noticed some swelling in her knee and leg since yesterday. Shoulder is causing some numbness in her arm to her finger tips. Patient has also tried elevating leg and applying ice.     Discussed the use of AI scribe software for clinical note transcription with the patient, who gave verbal consent to proceed.  History of Present Illness Hannah Hamilton is a 43 year old female with past medical history of HTN, obesity, lesion of liver presents today for an acute visit to discuss shoulder and knee pain on left side.   She experiences left knee pain following a fall while getting out of the shower a few weeks ago. The incident involved slipping on water, twisting her knee, and hearing a popping sound. 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 The next morning, she was unable to bear weight on the knee and sought care at urgent care on December 23, 2023, where x-rays were performed, and she was prescribed naproxen and given a knee brace. An MRI was recommended, but she has not yet had one. She continues to experience pain radiating down to her foot, decreased range of motion, and intermittent swelling. She uses naproxen, Tylenol , and ibuprofen  for pain management, but reports limited relief.  She also reports chronic shoulder pain, which she describes as a recurring issue that began last year. The pain is located at the top of her shoulder, is described as  tight and severe, and radiates down to her arm and fingertips. The pain was present before the fall but may have worsened after bracing herself during the incident. She experiences decreased range of motion and pain when lifting her shoulder over her head or carrying plates at work. She has not sought orthopedic treatment for her shoulder in the past.  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 She continues to smoke and wants to quit. She has been placed on light duty at work due to her knee and shoulder pain.     Patient Active Problem List   Diagnosis Date Noted   Acute pain of left shoulder 01/11/2024   Acute pain of left knee 01/11/2024   Nodule of skin of left foot 09/14/2023   Obesity, morbid (HCC) 07/19/2023   Lesion of liver 07/19/2023   Encounter to establish care with new doctor 07/19/2023   Hypertension 05/25/2023   Past Medical History:  Diagnosis Date   Bartholin cyst 05/25/2023   Depression 03/30/2019   Preeclampsia in postpartum period 03/29/2019   Pregnancy induced hypertension    Severe preeclampsia, postpartum condition 03/28/2019   Past Surgical History:  Procedure Laterality Date   BARTHOLIN GLAND CYST EXCISION     CESAREAN SECTION     CESAREAN SECTION WITH BILATERAL TUBAL LIGATION Bilateral 03/18/2019   Procedure: CESAREAN SECTION WITH BILATERAL TUBAL LIGATION;  Surgeon: Fredirick Glenys RAMAN, MD;  Location: [facility] LD ORS;  Service: Obstetrics;  Laterality: Bilateral;   TONSILLECTOMY     No Known Allergies       01/11/2024   10:18 AM 09/14/2023   11:32 AM 07/19/2023   10:55 AM  Depression screen PHQ 2/9  Decreased Interest 0 0 0  Down, Depressed, Hopeless 0 0 0  PHQ - 2 Score 0 0 0  Altered sleeping 0 0 0  Tired, decreased energy 0 0 0  Change in appetite 0 0 0  Feeling bad or failure about yourself  0 0 0  Trouble concentrating 0 0 1  Moving slowly or fidgety/restless 0 0 0  Suicidal thoughts 0 0 0  PHQ-9 Score 0  0 1  Difficult doing work/chores Not difficult at all Not difficult at all Not difficult at all       01/11/2024   10:18 AM 09/14/2023   11:32 AM 07/19/2023   10:56 AM 05/25/2023    9:23 AM  GAD 7 : Generalized Anxiety Score  Nervous, Anxious, on Edge 0 0 0 0  Control/stop worrying 0 0 0 0  Worry too much - different things 0 0 0 0  Trouble relaxing 0 0 0 0  Restless 0 0 0 0  Easily annoyed or irritable 0 0 1 0  Afraid - awful might happen 0 0 0 0  Total GAD 7 Score 0 0 1 0  Anxiety Difficulty Not difficult at all Not difficult at all Not difficult at all Not difficult at all      Review of Systems  Constitutional:  Negative for chills and fever.  Respiratory:  Negative for shortness of breath.   Cardiovascular:  Negative for chest pain.  Gastrointestinal:  Negative for abdominal pain, constipation, diarrhea, heartburn, nausea and vomiting.  Genitourinary:  Negative for dysuria, frequency and urgency.  Musculoskeletal:  Positive for joint pain.       Left shoulder and left knee pain.  Neurological:  Negative for dizziness and headaches.  Endo/Heme/Allergies:  Negative for polydipsia.  Psychiatric/Behavioral:  Negative for depression and suicidal ideas. The patient is not nervous/anxious.       Objective:     BP 122/78   Pulse 63   Temp 98 F (36.7 C) (Temporal)   Ht 5' 4 (1.626 m)   Wt 207 lb (93.9 kg)   SpO2 97%   BMI 35.53 kg/m  BP Readings from Last 3 Encounters:  01/11/24 122/78  09/14/23 (!) 110/58  08/22/23 116/76   Wt Readings from Last 3 Encounters:  01/11/24 207 lb (93.9 kg)  09/14/23 216 lb (98 kg)  08/22/23 212 lb 4 oz (96.3 kg)      Physical Exam Vitals and nursing note reviewed.  Constitutional:      Appearance: Normal appearance.  Cardiovascular:     Rate and Rhythm: Normal rate and regular rhythm.     Pulses: Normal pulses.     Heart sounds: Normal heart sounds.  Pulmonary:     Effort: Pulmonary effort is normal.     Breath sounds:  Normal breath sounds.  Musculoskeletal:     Right shoulder: Normal.     Left shoulder: Crepitus present. No swelling. Decreased range of motion.     Right knee: Normal.     Left knee: Swelling present. Decreased range of motion. Tenderness present.  Neurological:     Mental Status: She is alert and oriented to person, place, and time.  Psychiatric:  Mood and Affect: Mood normal.        Behavior: Behavior normal.        Thought Content: Thought content normal.        Judgment: Judgment normal.      No results found for any visits on 01/11/24.     The ASCVD Risk score (Arnett DK, et al., 2019) failed to calculate for the following reasons:   Cannot find a previous HDL lab   Cannot find a previous total cholesterol lab      Assessment and Plan Assessment & Plan Left knee pain with suspected internal derangement (possible meniscal injury) Left knee pain with possible meniscal injury, no fracture on X-ray. - Limited ROM on exam. - Order MRI of the left knee to rule out internal derangement. - Refer to orthopedics for further evaluation and management. - Prescribe tramadol  for pain management, to be used sparingly and primarily at night. - Advise continuation of naproxen and use of ice, heat, brace, and elevation. - Provide work note for light duty until further notice.  Acute on chronic left shoulder pain, possible arthritis or rotator cuff pathology, exacerbated by work duties. -limited ROM on exam. - Order x-ray of the left shoulder to assess for arthritis or other pathology. - Refer to orthopedics for further evaluation and management. - Discuss potential for steroid injections if arthritis is confirmed.  Tobacco use Continued tobacco use despite recommendation to quit. - Recommend over-the-counter nicotine patches for smoking cessation.  Assessment & Plan:   Return in about 2 weeks (around 01/25/2024) for shoulder pain and knee pain.   Carrol Aurora, NP

## 2024-01-17 ENCOUNTER — Telehealth: Payer: Self-pay

## 2024-01-17 NOTE — Telephone Encounter (Signed)
 Copied from CRM #[id number]. Topic: Clinical - Lab/Test Results >> Jan 17, 2024 12:07 PM Suzen RAMAN wrote: Reason for CRM: patient would like a call back to discuss X-Ray results when available.  CB#[phone] (M)

## 2024-01-18 NOTE — Telephone Encounter (Signed)
 Results not back yet ; called patient to let her know we will call her as soon as they come back and apologized for the delay with radiology shortage.

## 2024-01-25 ENCOUNTER — Ambulatory Visit: Admitting: General Practice

## 2024-01-26 ENCOUNTER — Ambulatory Visit: Payer: Self-pay | Admitting: General Practice

## 2024-02-01 ENCOUNTER — Ambulatory Visit: Admitting: General Practice

## 2024-02-01 DIAGNOSIS — M25562 Pain in left knee: Secondary | ICD-10-CM

## 2024-02-01 DIAGNOSIS — G8929 Other chronic pain: Secondary | ICD-10-CM

## 2024-02-08 ENCOUNTER — Telehealth: Payer: Self-pay

## 2024-02-08 ENCOUNTER — Ambulatory Visit: Payer: Self-pay | Admitting: General Practice

## 2024-02-08 ENCOUNTER — Encounter: Payer: Self-pay | Admitting: General Practice

## 2024-02-08 ENCOUNTER — Ambulatory Visit: Admitting: General Practice

## 2024-02-08 VITALS — BP 118/80 | HR 64 | Temp 97.3°F | Ht 64.0 in | Wt 198.8 lb

## 2024-02-08 DIAGNOSIS — G8929 Other chronic pain: Secondary | ICD-10-CM | POA: Diagnosis not present

## 2024-02-08 DIAGNOSIS — Z113 Encounter for screening for infections with a predominantly sexual mode of transmission: Secondary | ICD-10-CM | POA: Diagnosis not present

## 2024-02-08 DIAGNOSIS — M25512 Pain in left shoulder: Secondary | ICD-10-CM

## 2024-02-08 DIAGNOSIS — M25562 Pain in left knee: Secondary | ICD-10-CM | POA: Diagnosis not present

## 2024-02-08 DIAGNOSIS — R300 Dysuria: Secondary | ICD-10-CM | POA: Diagnosis not present

## 2024-02-08 LAB — BASIC METABOLIC PANEL WITH GFR
BUN: 9 mg/dL (ref 6–23)
CO2: 26 meq/L (ref 19–32)
Calcium: 9.1 mg/dL (ref 8.4–10.5)
Chloride: 106 meq/L (ref 96–112)
Creatinine, Ser: 0.73 mg/dL (ref 0.40–1.20)
GFR: 102.41 mL/min (ref >60.00)
Glucose, Bld: 97 mg/dL (ref 70–99)
Potassium: 3.6 meq/L (ref 3.5–5.1)
Sodium: 138 meq/L (ref 135–145)

## 2024-02-08 LAB — POC URINALSYSI DIPSTICK (AUTOMATED)
Bilirubin, UA: NEGATIVE
Blood, UA: 200
Glucose, UA: NEGATIVE
Ketones, UA: NEGATIVE
Nitrite, UA: NEGATIVE
Protein, UA: POSITIVE — AB
Spec Grav, UA: 1.015 (ref 1.010–1.025)
Urobilinogen, UA: 1 U/dL
pH, UA: 6 (ref 5.0–8.0)

## 2024-02-08 NOTE — Progress Notes (Addendum)
 Established Patient Office Visit  Subjective   Patient ID: Hannah Hamilton, female    DOB: [birth year]  Age: 43 y.o. MRN: [id number]  Chief Complaint  Patient presents with   Shoulder Pain    Patient following up on shoulder pain and left knee pain. Patient states neither are any better and has been out of the naproxen 500 mg tabs x 2 weeks; states they were not helping.    Dysuria    Painful/burning with urination x 4 days; also hurts to insert tampon as she is also on her cycle.     Shoulder Pain  Pertinent negatives include no fever.  Dysuria  Associated symptoms include frequency and urgency. Pertinent negatives include no chills, flank pain, nausea or vomiting.   Discussed the use of AI scribe software for clinical note transcription with the patient, who gave verbal consent to proceed.  History of Present Illness Hannah Hamilton is a 43 year old female who presents with persistent left shoulder and knee pain, and new onset urinary symptoms.  She has persistent left shoulder pain that has not improved despite previous x-rays showing no abnormalities. Various treatments including tramadol  50 mg every eight hours, ice, heat, Tylenol , and ibuprofen  have been ineffective. The pain is severe enough to disrupt her sleep, causing her to be 'up and down on my arm' during the night.  She also reports left knee pain for which she had an x-ray that was negative. An MRI was recommended by orthopedics on July 26, but it has not been scheduled yet. She was given a brace, which she wears regularly, especially at work. Tramadol  and naproxen have not alleviated the knee pain.  For the past four days, she has been experiencing urinary symptoms, including burning and painful urination, urgency, and frequency. These symptoms began before her menstrual period, which started on Monday. No back pain, fever, chills, nausea, dizziness, or headaches. She is currently on her period, making it difficult to assess for  hematuria.  She is sexually active and expresses concern about potential exposure to STDs.  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    Patient Active Problem List   Diagnosis Date Noted   Chronic left shoulder pain 02/08/2024   Dysuria 02/08/2024   Acute pain of left shoulder 01/11/2024   Acute pain of left knee 01/11/2024   Nodule of skin of left foot 09/14/2023   Obesity, morbid (HCC) 07/19/2023   Lesion of liver 07/19/2023   Encounter to establish care with new doctor 07/19/2023   Hypertension 05/25/2023   Past Medical History:  Diagnosis Date   Bartholin cyst 05/25/2023   Depression 03/30/2019   Preeclampsia in postpartum period 03/29/2019   Pregnancy induced hypertension    Severe preeclampsia, postpartum condition 03/28/2019   Past Surgical History:  Procedure Laterality Date   BARTHOLIN GLAND CYST EXCISION     CESAREAN SECTION     CESAREAN SECTION WITH BILATERAL TUBAL LIGATION Bilateral 03/18/2019   Procedure: CESAREAN SECTION WITH BILATERAL TUBAL LIGATION;  Surgeon: Fredirick Glenys RAMAN, MD;  Location: [facility] LD ORS;  Service: Obstetrics;  Laterality: Bilateral;   TONSILLECTOMY     No Known Allergies       02/08/2024   12:05 PM 01/11/2024   10:18 AM 09/14/2023   11:32 AM  Depression screen PHQ 2/9  Decreased Interest 0 0 0  Down, Depressed, Hopeless 0 0 0  PHQ -  2 Score 0 0 0  Altered sleeping 1 0 0  Tired, decreased energy 0 0 0  Change in appetite 0 0 0  Feeling bad or failure about yourself  0 0 0  Trouble concentrating 0 0 0  Moving slowly or fidgety/restless 0 0 0  Suicidal thoughts 0 0 0  PHQ-9 Score 1 0 0  Difficult doing work/chores Very difficult Not difficult at all Not difficult at all       02/08/2024   12:05 PM 01/11/2024   10:18 AM 09/14/2023   11:32 AM 07/19/2023   10:56 AM  GAD 7 : Generalized Anxiety Score  Nervous, Anxious, on Edge 0 0 0 0   Control/stop worrying 0 0 0 0  Worry too much - different things 0 0 0 0  Trouble relaxing 0 0 0 0  Restless 0 0 0 0  Easily annoyed or irritable 0 0 0 1  Afraid - awful might happen 0 0 0 0  Total GAD 7 Score 0 0 0 1  Anxiety Difficulty Not difficult at all Not difficult at all Not difficult at all Not difficult at all      Review of Systems  Constitutional:  Negative for chills and fever.  Respiratory:  Negative for shortness of breath.   Cardiovascular:  Negative for chest pain.  Gastrointestinal:  Negative for abdominal pain, constipation, diarrhea, heartburn, nausea and vomiting.  Genitourinary:  Positive for dysuria, frequency and urgency. Negative for flank pain.  Musculoskeletal:  Positive for joint pain.       Left knee and left shoulder pain.  Neurological:  Negative for dizziness and headaches.  Endo/Heme/Allergies:  Negative for polydipsia.  Psychiatric/Behavioral:  Negative for depression and suicidal ideas. The patient is not nervous/anxious.       Objective:     BP 118/80   Pulse 64   Temp (!) 97.3 F (36.3 C) (Temporal)   Ht 5' 4 (1.626 m)   Wt 198 lb 12.8 oz (90.2 kg)   LMP 01/11/2024 (Exact Date)   SpO2 95%   BMI 34.12 kg/m  BP Readings from Last 3 Encounters:  02/08/24 118/80  01/11/24 122/78  09/14/23 (!) 110/58   Wt Readings from Last 3 Encounters:  02/08/24 198 lb 12.8 oz (90.2 kg)  01/11/24 207 lb (93.9 kg)  09/14/23 216 lb (98 kg)      Physical Exam Vitals and nursing note reviewed.  Constitutional:      Appearance: Normal appearance.  Cardiovascular:     Rate and Rhythm: Normal rate and regular rhythm.     Pulses: Normal pulses.     Heart sounds: Normal heart sounds.  Pulmonary:     Effort: Pulmonary effort is normal.     Breath sounds: Normal breath sounds.  Neurological:     Mental Status: She is alert and oriented to person, place, and time.  Psychiatric:        Mood and Affect: Mood normal.        Behavior: Behavior  normal.        Thought Content: Thought content normal.        Judgment: Judgment normal.      Results for orders placed or performed in visit on 02/08/24  POCT Urinalysis Dipstick (Automated)  Result Value Ref Range   Color, UA yellow    Clarity, UA clear    Glucose, UA Negative Negative   Bilirubin, UA neg    Ketones, UA neg    Spec Grav,  UA 1.015 1.010 - 1.025   Blood, UA 200 ery/ul    pH, UA 6.0 5.0 - 8.0   Protein, UA Positive (A) Negative   Urobilinogen, UA 1.0 0.2 or 1.0 E.U./dL   Nitrite, UA neg    Leukocytes, UA Small (1+) (A) Negative       The ASCVD Risk score (Arnett DK, et al., 2019) failed to calculate for the following reasons:   Cannot find a previous HDL lab   Cannot find a previous total cholesterol lab    Assessment & Plan:  Chronic left shoulder pain  Acute pain of left knee  Dysuria -     POCT Urinalysis Dipstick (Automated) -     Urine Culture -     CBC With Differential/Platelet -     Basic metabolic panel with GFR  Routine screening for STI (sexually transmitted infection) -     RPR -     HIV Antibody (routine testing w rflx) -     Hepatitis C antibody -     Chlamydia/Gonococcus/Trichomonas, NAA -     HSV(herpes simplex vrs) 1+2 ab-IgG    Assessment and Plan Assessment & Plan Left shoulder pain Chronic pain unresponsive to medications and non-pharmacological measures. Previous imaging negative for structural issues. - failed multiple treatments.  - work note provided for light duty. - Refer to orthopedic specialist.  - Continue Tylenol  and ibuprofen .  Left knee pain Chronic pain unresponsive to medications. Previous x-ray negative. MRI pending. - Schedule stat MRI for left knee. - Refer to orthopedic specialist.   Dysuria Dysuria with urinary urgency and frequency, no systemic symptoms. - UA shows leuks and blood in urine. Negative for ketones, nitrites.  - Urine culture pending.  - CBC with diff and BMP pending.  Screening  for sexually transmitted infections Requests STI screening due to potential exposure. - STI panel pending.   Return if symptoms worsen or fail to improve.    Carrol Aurora, NP

## 2024-02-08 NOTE — Telephone Encounter (Signed)
 Disc of patients x ray from 8.14.25 is ready for pick up at the front desk.  Pt. Notified it was ready to pick up at her convenience.

## 2024-02-08 NOTE — Patient Instructions (Addendum)
 Stop by the lab prior to leaving today. I will notify you of your results once received.   Call and schedule MRI- [facility] [phone].  Appt is 02/09/24 at 10am, arrive by 9:45am  [facility] [address] [place]    **Bring Xray disc with her to the appt**  I will be in touch about your urine test.   They will be in touch regarding the ortho referral.   It was a pleasure to see you today!

## 2024-02-09 LAB — HEPATITIS C ANTIBODY: Hepatitis C Ab: NONREACTIVE

## 2024-02-09 LAB — HSV(HERPES SIMPLEX VRS) I + II AB-IGG
HSV 1 IGG,TYPE SPECIFIC AB: <0.9 {index}
HSV 2 IGG,TYPE SPECIFIC AB: 6.92 {index} — ABNORMAL HIGH

## 2024-02-09 LAB — CBC WITH DIFFERENTIAL/PLATELET
Absolute Lymphocytes: 3006 {cells}/uL (ref 850–3900)
Absolute Monocytes: 504 {cells}/uL (ref 200–950)
Basophils Absolute: 27 {cells}/uL (ref 0–200)
Basophils Relative: 0.3 %
Eosinophils Absolute: 108 {cells}/uL (ref 15–500)
Eosinophils Relative: 1.2 %
HCT: 41.3 % (ref 35.0–45.0)
Hemoglobin: 14.1 g/dL (ref 11.7–15.5)
MCH: 31.8 pg (ref 27.0–33.0)
MCHC: 34.1 g/dL (ref 32.0–36.0)
MCV: 93.2 fL (ref 80.0–100.0)
MPV: 11.2 fL (ref 7.5–12.5)
Monocytes Relative: 5.6 %
Neutro Abs: 5355 {cells}/uL (ref 1500–7800)
Neutrophils Relative %: 59.5 %
Platelets: 290 Thousand/uL (ref 140–400)
RBC: 4.43 Million/uL (ref 3.80–5.10)
RDW: 11.8 % (ref 11.0–15.0)
Total Lymphocyte: 33.4 %
WBC: 9 Thousand/uL (ref 3.8–10.8)

## 2024-02-09 LAB — URINE CULTURE
MICRO NUMBER:: 16955267
SPECIMEN QUALITY:: ADEQUATE

## 2024-02-09 LAB — RPR: RPR Ser Ql: NONREACTIVE

## 2024-02-09 LAB — HIV ANTIBODY (ROUTINE TESTING W REFLEX)
HIV 1&2 Ab, 4th Generation: NONREACTIVE
HIV FINAL INTERPRETATION: NEGATIVE

## 2024-02-10 LAB — CHLAMYDIA/GONOCOCCUS/TRICHOMONAS, NAA
Chlamydia by NAA: NEGATIVE
Gonococcus by NAA: NEGATIVE
Trich vag by NAA: NEGATIVE

## 2024-02-20 ENCOUNTER — Ambulatory Visit

## 2024-02-20 DIAGNOSIS — M25562 Pain in left knee: Secondary | ICD-10-CM | POA: Diagnosis not present

## 2024-03-01 DIAGNOSIS — Z01818 Encounter for other preprocedural examination: Secondary | ICD-10-CM | POA: Diagnosis not present

## 2024-03-18 DIAGNOSIS — M25462 Effusion, left knee: Secondary | ICD-10-CM | POA: Diagnosis not present

## 2024-03-18 DIAGNOSIS — M1712 Unilateral primary osteoarthritis, left knee: Secondary | ICD-10-CM | POA: Diagnosis not present

## 2024-04-05 DIAGNOSIS — M25562 Pain in left knee: Secondary | ICD-10-CM | POA: Diagnosis not present

## 2024-05-22 ENCOUNTER — Encounter: Payer: Self-pay | Admitting: General Practice

## 2024-05-22 ENCOUNTER — Ambulatory Visit: Admitting: General Practice

## 2024-05-22 VITALS — BP 120/82 | HR 86 | Temp 98.0°F | Ht 64.0 in | Wt 206.0 lb

## 2024-05-22 DIAGNOSIS — F17200 Nicotine dependence, unspecified, uncomplicated: Secondary | ICD-10-CM

## 2024-05-22 DIAGNOSIS — G44209 Tension-type headache, unspecified, not intractable: Secondary | ICD-10-CM

## 2024-05-22 MED ORDER — KETOROLAC TROMETHAMINE 60 MG/2ML IM SOLN
60.0000 mg | Freq: Once | INTRAMUSCULAR | Status: AC
  Administered 2024-05-22: 60 mg via INTRAMUSCULAR

## 2024-05-22 NOTE — Progress Notes (Signed)
 "  Established Patient Office Visit  Subjective   Patient ID: Hannah Hamilton, female    DOB: 1980/06/30  Age: 43 y.o. MRN: [id number]  Chief Complaint  Patient presents with   Headache    Off and on x 6 days. Patient states she has even been woken up in the middle of the night with a bad headache. Patient states when she gets up she can have one, bending over hurts her head and if she has to cough it hurts worse. Patient has been tylenol  for her headaches.     Headache  Pertinent negatives include no abdominal pain, dizziness, ear pain, fever, nausea, sore throat or vomiting.    Discussed the use of AI scribe software for clinical note transcription with the patient, who gave verbal consent to proceed.  History of Present Illness Hannah Hamilton is a 43 year old female who presents with a severe headache for one week.  She has been experiencing a severe headache for almost a week, described as a squeezing sensation in the bilateral frontal lobes, rated as an 8 out of 10 in intensity. The headache worsens when bending over or coughing and has been severe enough to wake her from sleep. No blurred vision, nausea, vomiting, fever, chills, chest pain, shortness of breath, sinus pain, sore throat, dizziness, or anxiety.  She has tried Tylenol  for relief, taking it every four to six hours, but it has not been effective. She is not currently on any other medications.  She has a history of high blood pressure, but her blood pressure readings have been normal at home and during plasma donations.  She mentions having a cough that is not consistent and does not feel like she is sick. The cough exacerbates her headache, making it feel like her head is going to explode.  She is a smoker. She works as a child psychotherapist.    Patient Active Problem List   Diagnosis Date Noted   Acute non intractable tension-type headache 05/22/2024   Smoker 05/22/2024   Chronic left shoulder pain 02/08/2024   Dysuria 02/08/2024    Acute pain of left shoulder 01/11/2024   Acute pain of left knee 01/11/2024   Nodule of skin of left foot 09/14/2023   Obesity, morbid (HCC) 07/19/2023   Lesion of liver 07/19/2023   Encounter to establish care with new doctor 07/19/2023   Hypertension 05/25/2023   Past Medical History:  Diagnosis Date   Bartholin cyst 05/25/2023   Depression 03/30/2019   Preeclampsia in postpartum period 03/29/2019   Pregnancy induced hypertension    Severe preeclampsia, postpartum condition 03/28/2019   Past Surgical History:  Procedure Laterality Date   BARTHOLIN GLAND CYST EXCISION     CESAREAN SECTION     CESAREAN SECTION WITH BILATERAL TUBAL LIGATION Bilateral 03/18/2019   Procedure: CESAREAN SECTION WITH BILATERAL TUBAL LIGATION;  Surgeon: Fredirick Glenys RAMAN, MD;  Location: [facility] OR[facility];  Service: Obstetrics;  Laterality: Bilateral;   TONSILLECTOMY     Allergies[1]       05/22/2024    9:07 AM 02/08/2024   12:05 PM 01/11/2024   10:18 AM  Depression screen PHQ 2/9  Decreased Interest 0 0 0  Down, Depressed, Hopeless 0 0 0  PHQ - 2 Score 0 0 0  Altered sleeping 0 1 0  Tired, decreased energy 0 0 0  Change in appetite 0 0 0  Feeling bad or failure about yourself  0 0 0  Trouble concentrating 0 0 0  Moving  slowly or fidgety/restless 0 0 0  Suicidal thoughts 0 0 0  PHQ-9 Score 0 1  0   Difficult doing work/chores Not difficult at all Very difficult Not difficult at all     Data saved with a previous flowsheet row definition       05/22/2024    9:07 AM 02/08/2024   12:05 PM 01/11/2024   10:18 AM 09/14/2023   11:32 AM  GAD 7 : Generalized Anxiety Score  Nervous, Anxious, on Edge 0 0 0 0  Control/stop worrying 0 0 0 0  Worry too much - different things 0 0 0 0  Trouble relaxing 0 0 0 0  Restless 0 0 0 0  Easily annoyed or irritable 0 0 0 0  Afraid - awful might happen 0 0 0 0  Total GAD 7 Score 0 0 0 0  Anxiety Difficulty Not difficult at all Not difficult at all Not difficult at  all Not difficult at all      Review of Systems  Constitutional:  Negative for chills and fever.  HENT:  Negative for congestion, ear pain, sinus pain and sore throat.   Respiratory:  Negative for shortness of breath.   Cardiovascular:  Negative for chest pain.  Gastrointestinal:  Negative for abdominal pain, constipation, diarrhea, heartburn, nausea and vomiting.  Genitourinary:  Negative for dysuria, frequency and urgency.  Neurological:  Positive for headaches. Negative for dizziness.  Endo/Heme/Allergies:  Negative for polydipsia.  Psychiatric/Behavioral:  Negative for depression and suicidal ideas. The patient is not nervous/anxious.       Objective:     BP 120/82   Pulse 86   Temp 98 F (36.7 C) (Temporal)   Ht 5' 4 (1.626 m)   Wt 206 lb (93.4 kg)   SpO2 98%   BMI 35.36 kg/m  BP Readings from Last 3 Encounters:  05/22/24 120/82  02/08/24 118/80  01/11/24 122/78   Wt Readings from Last 3 Encounters:  05/22/24 206 lb (93.4 kg)  02/08/24 198 lb 12.8 oz (90.2 kg)  01/11/24 207 lb (93.9 kg)      Physical Exam Vitals and nursing note reviewed.  Constitutional:      Appearance: Normal appearance.  Cardiovascular:     Rate and Rhythm: Normal rate and regular rhythm.     Pulses: Normal pulses.     Heart sounds: Normal heart sounds.  Pulmonary:     Effort: Pulmonary effort is normal.     Breath sounds: Normal breath sounds.  Neurological:     General: No focal deficit present.     Mental Status: She is alert and oriented to person, place, and time.     Cranial Nerves: Cranial nerves 2-12 are intact.     Sensory: Sensation is intact.     Motor: Motor function is intact.     Coordination: Coordination is intact.     Gait: Gait is intact.  Psychiatric:        Mood and Affect: Mood normal.        Behavior: Behavior normal.        Thought Content: Thought content normal.        Judgment: Judgment normal.      No results found for any visits on  05/22/24.     The ASCVD Risk score (Arnett DK, et al., 2019) failed to calculate for the following reasons:   Cannot find a previous HDL lab   Cannot find a previous total cholesterol lab   * -  Cholesterol units were assumed    Assessment & Plan:  Acute non intractable tension-type headache  Smoker Assessment & Plan: Smoking cessation instruction/counseling given:  counseled patient on the dangers of tobacco use, advised patient to stop smoking, and reviewed strategies to maximize success      Assessment and Plan Assessment & Plan Acute tension-type headache Bilateral frontal lobe pain, rated 8/10, consistent with tension-type headache, likely stress-related. - Administered Toradol  injection for immediate relief. - no red flags on exam. Neuro exam stable.  - Advised rest and avoidance of stressors. - Recommended over-the-counter naproxen every six hours with food if headache recurs. - work note provided. - Instructed to update provider next week if symptoms do not improve.    Return if symptoms worsen or fail to improve.    Carrol Aurora, NP     [1] No Known Allergies  "

## 2024-05-22 NOTE — Patient Instructions (Signed)
 As discussed please go to the ER if symptoms worsen or do not improve.   Continue ibuprofen  or aleve every 6 hours as needed if headache persists.   Update me next week if symptoms are not better.   It was a pleasure to see you today!

## 2024-05-22 NOTE — Assessment & Plan Note (Signed)
 Smoking cessation instruction/counseling given:  counseled patient on the dangers of tobacco use, advised patient to stop smoking, and reviewed strategies to maximize success

## 2024-05-22 NOTE — Addendum Note (Signed)
 Addended by: TENNIE RAISIN B on: 05/22/2024 09:57 AM   Modules accepted: Orders

## 2024-06-06 ENCOUNTER — Ambulatory Visit: Admitting: Obstetrics and Gynecology

## 2024-06-06 VITALS — BP 120/79 | HR 70 | Wt 208.0 lb

## 2024-06-06 DIAGNOSIS — Z3202 Encounter for pregnancy test, result negative: Secondary | ICD-10-CM | POA: Diagnosis not present

## 2024-06-06 DIAGNOSIS — N911 Secondary amenorrhea: Secondary | ICD-10-CM | POA: Diagnosis not present

## 2024-06-06 LAB — POCT URINE PREGNANCY: Preg Test, Ur: NEGATIVE

## 2024-06-06 MED ORDER — MEDROXYPROGESTERONE ACETATE 10 MG PO TABS: 10.0000 mg | ORAL_TABLET | Freq: Every day | ORAL | 0 refills | Status: AC

## 2024-06-07 ENCOUNTER — Ambulatory Visit: Payer: Self-pay | Admitting: General Practice

## 2024-06-07 DIAGNOSIS — Z91199 Patient's noncompliance with other medical treatment and regimen due to unspecified reason: Secondary | ICD-10-CM

## 2024-06-07 DIAGNOSIS — G44209 Tension-type headache, unspecified, not intractable: Secondary | ICD-10-CM

## 2024-06-07 NOTE — Progress Notes (Signed)
 Patient no-showed today's appointment for acute visit.

## 2024-06-10 NOTE — Progress Notes (Signed)
 Obstetrics and Gynecology Visit Return Patient Evaluation  Appointment Date: 06/06/2024  Primary Care Provider: Vincente Shivers  OBGYN Clinic: [facility]  Chief Complaint: no period since late sept/early august  History of Present Illness:  Elyanah Farino is a 44 y.o. with above chief complaints. Qmonth, regular periods prior to this. No menopausal s/s and no recent weight gain, increased stressors, new meds or stopping of prior meds.    Pap 04/2023 negative and hpv negative  Review of Systems: as noted in the History of Present Illness.  Patient Active Problem List   Diagnosis Date Noted   Acute non intractable tension-type headache 05/22/2024   Smoker 05/22/2024   Chronic left shoulder pain 02/08/2024   Dysuria 02/08/2024   Acute pain of left shoulder 01/11/2024   Acute pain of left knee 01/11/2024   Nodule of skin of left foot 09/14/2023   Obesity, morbid (HCC) 07/19/2023   Lesion of liver 07/19/2023   Encounter to establish care with new doctor 07/19/2023   Hypertension 05/25/2023   Medications: None  Allergies: has no known allergies.  Physical Exam:  BP 120/79   Pulse 70   Wt 208 lb (94.3 kg)   LMP 01/28/2024 (Approximate)   BMI 35.70 kg/m  Body mass index is 35.7 kg/m. General appearance: Well nourished, well developed female in no acute distress.  Neuro/Psych:  Normal mood and affect.    Labs: UPT negative  Assessment: patient stable.   Plan:  1. Secondary amenorrhea (Primary) Recommend progestin challenge. If periods still don't come back to being qmonth, regular after that, patient to let me know, as well if she gets no bleeding after the progestin challenge. Provera  10mg  qday x 14 days sent in.  - POCT urine pregnancy   RTC: PRN  Bebe Izell Raddle MD Attending [facility])
